# Patient Record
Sex: Female | Born: 1975 | Race: Black or African American | Hispanic: No | Smoking: Heavy tobacco smoker
Health system: Southern US, Community
[De-identification: ages and names within clinical notes are randomized; demographics above are authoritative.]

## PROBLEM LIST (undated history)

## (undated) DIAGNOSIS — J45909 Unspecified asthma, uncomplicated: Secondary | ICD-10-CM

## (undated) DIAGNOSIS — R569 Unspecified convulsions: Secondary | ICD-10-CM

## (undated) DIAGNOSIS — E079 Disorder of thyroid, unspecified: Secondary | ICD-10-CM

## (undated) DIAGNOSIS — D573 Sickle-cell trait: Secondary | ICD-10-CM

## (undated) DIAGNOSIS — E119 Type 2 diabetes mellitus without complications: Secondary | ICD-10-CM

## (undated) HISTORY — PX: TONSILLECTOMY: SUR1361

## (undated) HISTORY — PX: TUBAL LIGATION: SHX77

## (undated) HISTORY — PX: CYSTECTOMY: SHX5119

---

## 2009-11-09 ENCOUNTER — Ambulatory Visit: Payer: Self-pay | Admitting: Family Medicine

## 2010-01-12 ENCOUNTER — Emergency Department: Payer: Self-pay | Admitting: Emergency Medicine

## 2012-08-28 ENCOUNTER — Emergency Department: Payer: Self-pay | Admitting: Emergency Medicine

## 2012-08-28 LAB — CBC WITH DIFFERENTIAL/PLATELET
Basophil #: 0.1 10*3/uL (ref 0.0–0.1)
Basophil %: 0.6 %
Eosinophil %: 0.4 %
HCT: 37.1 % (ref 35.0–47.0)
Lymphocyte %: 31.2 %
MCH: 32.9 pg (ref 26.0–34.0)
MCHC: 34 g/dL (ref 32.0–36.0)
Monocyte %: 9.1 %
Platelet: 266 10*3/uL (ref 150–440)
WBC: 9.2 10*3/uL (ref 3.6–11.0)

## 2012-08-28 LAB — COMPREHENSIVE METABOLIC PANEL
Anion Gap: 9 (ref 7–16)
BUN: 10 mg/dL (ref 7–18)
Bilirubin,Total: 0.5 mg/dL (ref 0.2–1.0)
Calcium, Total: 9 mg/dL (ref 8.5–10.1)
Chloride: 111 mmol/L — ABNORMAL HIGH (ref 98–107)
Co2: 24 mmol/L (ref 21–32)
EGFR (Non-African Amer.): >60
Glucose: 96 mg/dL (ref 65–99)
Osmolality: 286 (ref 275–301)
Potassium: 3.8 mmol/L (ref 3.5–5.1)
SGPT (ALT): 18 U/L (ref 12–78)

## 2012-08-28 LAB — URINALYSIS, COMPLETE
Bilirubin,UR: NEGATIVE
Ketone: NEGATIVE
Nitrite: POSITIVE
Ph: 5 (ref 4.5–8.0)
Protein: 30
Squamous Epithelial: 17
WBC UR: 67 /HPF (ref 0–5)

## 2012-08-28 LAB — DRUG SCREEN, URINE
Amphetamines, Ur Screen: NEGATIVE (ref ?–1000)
Barbiturates, Ur Screen: NEGATIVE (ref ?–200)
Benzodiazepine, Ur Scrn: NEGATIVE (ref ?–200)
Cannabinoid 50 Ng, Ur ~~LOC~~: POSITIVE (ref ?–50)
Cocaine Metabolite,Ur ~~LOC~~: NEGATIVE (ref ?–300)
MDMA (Ecstasy)Ur Screen: NEGATIVE (ref ?–500)
Methadone, Ur Screen: NEGATIVE (ref ?–300)
Opiate, Ur Screen: NEGATIVE (ref ?–300)
Phencyclidine (PCP) Ur S: NEGATIVE (ref ?–25)
Tricyclic, Ur Screen: NEGATIVE (ref ?–1000)

## 2012-08-28 LAB — ETHANOL
Ethanol %: 0.102 % — ABNORMAL HIGH (ref 0.000–0.080)
Ethanol: 102 mg/dL

## 2012-12-06 ENCOUNTER — Emergency Department: Payer: Self-pay | Admitting: Emergency Medicine

## 2012-12-06 LAB — COMPREHENSIVE METABOLIC PANEL
Anion Gap: 7 (ref 7–16)
Anion Gap: 11 (ref 7–16)
BUN: 11 mg/dL (ref 7–18)
Bilirubin,Total: 0.4 mg/dL (ref 0.2–1.0)
Bilirubin,Total: 0.5 mg/dL (ref 0.2–1.0)
Chloride: 107 mmol/L (ref 98–107)
Chloride: 104 mmol/L (ref 98–107)
Co2: 20 mmol/L — ABNORMAL LOW (ref 21–32)
Creatinine: 0.68 mg/dL (ref 0.60–1.30)
Creatinine: 0.76 mg/dL (ref 0.60–1.30)
EGFR (African American): >60
EGFR (African American): >60
Glucose: 84 mg/dL (ref 65–99)
Glucose: 102 mg/dL — ABNORMAL HIGH (ref 65–99)
Osmolality: 271 (ref 275–301)
Osmolality: 269 (ref 275–301)
Potassium: 3.7 mmol/L (ref 3.5–5.1)
SGOT(AST): 23 U/L (ref 15–37)
SGPT (ALT): 25 U/L (ref 12–78)
SGPT (ALT): 29 U/L (ref 12–78)
Sodium: 135 mmol/L — ABNORMAL LOW (ref 136–145)

## 2012-12-06 LAB — CBC WITH DIFFERENTIAL/PLATELET
Basophil #: 0 10*3/uL (ref 0.0–0.1)
Basophil %: 0.5 %
Eosinophil %: 0.3 %
HCT: 36.9 % (ref 35.0–47.0)
Lymphocyte #: 1.6 10*3/uL (ref 1.0–3.6)
MCH: 32.6 pg (ref 26.0–34.0)
MCHC: 34.2 g/dL (ref 32.0–36.0)
Monocyte #: 0.9 x10 3/mm (ref 0.2–0.9)
Neutrophil #: 5 10*3/uL (ref 1.4–6.5)
Neutrophil %: 66.7 %
Platelet: 193 10*3/uL (ref 150–440)

## 2012-12-06 LAB — URINALYSIS, COMPLETE
Bilirubin,UR: NEGATIVE
Blood: NEGATIVE
Glucose,UR: NEGATIVE mg/dL (ref 0–75)
Glucose,UR: NEGATIVE mg/dL (ref 0–75)
Nitrite: POSITIVE
Nitrite: POSITIVE
Ph: 6 (ref 4.5–8.0)
RBC,UR: 4 /HPF (ref 0–5)
RBC,UR: 4 /HPF (ref 0–5)
Specific Gravity: 1.021 (ref 1.003–1.030)
Specific Gravity: 1.016 (ref 1.003–1.030)
Squamous Epithelial: 31
Squamous Epithelial: 12

## 2012-12-06 LAB — CBC
HCT: 37.2 % (ref 35.0–47.0)
MCH: 32.3 pg (ref 26.0–34.0)
MCHC: 34.3 g/dL (ref 32.0–36.0)
Platelet: 221 10*3/uL (ref 150–440)
RBC: 3.95 10*6/uL (ref 3.80–5.20)
RDW: 13.5 % (ref 11.5–14.5)
WBC: 6.8 10*3/uL (ref 3.6–11.0)

## 2012-12-06 LAB — DRUG SCREEN, URINE
Amphetamines, Ur Screen: NEGATIVE (ref ?–1000)
Amphetamines, Ur Screen: NEGATIVE (ref ?–1000)
Benzodiazepine, Ur Scrn: NEGATIVE (ref ?–200)
Benzodiazepine, Ur Scrn: NEGATIVE (ref ?–200)
Cannabinoid 50 Ng, Ur ~~LOC~~: POSITIVE (ref ?–50)
Cannabinoid 50 Ng, Ur ~~LOC~~: POSITIVE (ref ?–50)
Cocaine Metabolite,Ur ~~LOC~~: NEGATIVE (ref ?–300)
Cocaine Metabolite,Ur ~~LOC~~: NEGATIVE (ref ?–300)
MDMA (Ecstasy)Ur Screen: NEGATIVE (ref ?–500)
Methadone, Ur Screen: NEGATIVE (ref ?–300)
Methadone, Ur Screen: NEGATIVE (ref ?–300)
Phencyclidine (PCP) Ur S: NEGATIVE (ref ?–25)
Tricyclic, Ur Screen: NEGATIVE (ref ?–1000)
Tricyclic, Ur Screen: NEGATIVE (ref ?–1000)

## 2012-12-06 LAB — PREGNANCY, URINE: Pregnancy Test, Urine: NEGATIVE m[IU]/mL

## 2014-06-17 NOTE — Consult Note (Signed)
PATIENT NAME:  Jordan Bauer, Jordan Bauer MR#:  [id number] DATE OF BIRTH:  10-05-1975  DATE OF CONSULTATION:  08/28/2012  REFERRING PHYSICIAN:   CONSULTING PHYSICIAN:  Gonzella Lex, MD  IDENTIFYING INFORMATION AND REASON FOR CONSULTATION: A 39 year old woman who came into the hospital agitated. She was uncooperative with treatment and was sedated early in her hospitalization. Consult for psychiatric evaluation.   HISTORY OF PRESENT ILLNESS: Information obtained from the patient and the chart. The chart indicates only that she came into the hospital belligerent and agitated and uncooperative and required sedation. Labs indicate that she was intoxicated and had positive drug screen for cannabis. On evaluation today, the patient reports that she was told she had a seizure. She says she was at a friend's house and remembers her friend telling her that she had a seizure and calling 911. The patient says that seizures are usually episodes in which she gets angry and agitated and emotionally upset and has "blackout" kind of behavior. She admits that she had been drinking and had had 2 drinks yesterday, which is more than her usual. She also admits that she has been using marijuana regularly. She says that she had not been feeling depressed, not been having psychotic symptoms, not been having any other overwhelming stress recently that she can identify. Currently, she denies psychotic symptoms and denies any suicidal or homicidal ideation.   PAST PSYCHIATRIC HISTORY: Denies previous psychiatric hospitalizations. She has been seen at local mental health centers in the past and remembers that she was treated with Zoloft and Risperdal, although she has not been taking them in years. She says she was told that she had bipolar disorder. She does not describe what sounds like a typical mania, however. The patient denies any history of suicide attempts, denies any physical violence towards others.   SUBSTANCE ABUSE HISTORY:  She claims she drinks only infrequently and that she gets drunk very easily. She drank more than usual yesterday. Admits that she uses marijuana on a daily basis. Denies other drugs of abuse.   PAST MEDICAL HISTORY: She claims that she has these seizures very irregularly, cannot really give me much useful history about them. She otherwise denies any significant ongoing medical problems.   SOCIAL HISTORY: The patient is married, has 4 children, 2 of whom still live with her. She works at [employer]. She denies having any acute legal problems.   CURRENT MEDICATIONS: None.   ALLERGIES: AMOXICILLIN, ASPIRIN, KEFLEX.  REVIEW OF SYSTEMS: The patient currently denies depression or anxiety or agitation or anger. Denies suicidal or homicidal ideation. Denies hallucinations. Denies physical symptoms, not feeling dizzy, no pain, no nausea, no pulmonary problems.   MENTAL STATUS EXAMINATION: Slightly disheveled woman who looks her stated age, cooperative with the interview. Eye contact good. Psychomotor activity normal. Speech normal in rate, tone and volume. Affect euthymic, reactive, appropriate. Mood stated as being fine. Thoughts appear to be lucid with no loosening of associations or delusions. Denies auditory or visual hallucinations. Denies suicidal or homicidal ideation. Intelligence probably average. Judgment and insight intermittently impaired, but adequate for the moment. Alert and oriented x 4.   LABORATORY RESULTS: Labs when she presented to the Emergency Room include alcohol level of 102, magnesium 1.8. Chemistry profile normal, except for a slightly elevated chloride of no significance. Drug screen positive for cannabis. CBC normal.  Urinalysis likely positive urinary tract infection, multiple signs of it.  ASSESSMENT: A 39 year old woman who presents agitated while intoxicated. Was belligerent and uncooperative and  sedated in the Emergency Room. Several hours later she has sobered up. She  is currently cooperative and appropriate. Affect is normal. Thoughts appear to be lucid and normal. No sign of psychotic thinking. Denies suicidal or homicidal ideation. She is not showing acute signs of alcohol withdrawal. At this point, the patient no longer meets commitment criteria. The patient probably was intoxicated and may have a tendency to emotional dysregulation as she has suggested. Now that she has sobered up she appears to be back at her baseline and not acutely dangerous. We have been in contact with her husband who agrees that she is not acutely dangerous and has no concerns about her returning home.   TREATMENT PLAN: Discontinue commitment. I counseled the patient on the importance of avoiding alcohol if she is prone to seizures and following up with outpatient mental health treatment. She will be given a referral to local mental health agencies and it is recommended that she follow up there for evaluation and treatment of her mood instability. The Emergency Room doctor can follow up with treating her urinary tract infection. No longer needs psychiatric treatment in the hospital.   DIAGNOSIS, PRINCIPAL AND PRIMARY:  AXIS I: Alcohol intoxication.   SECONDARY DIAGNOSES: AXIS I:  1.  Adjustment disorder with mixed disturbance of emotions and conduct, resolved.  2.  Marijuana abuse.  AXIS II: Deferred.  AXIS III: Urinary tract infection.  AXIS IV: Moderate chronic stress from lack of resources, lack of finances to get medical treatment.  AXIS V: Functioning at time of evaluation 45. ____________________________ Gonzella Lex, MD jtc:sb D: 08/28/2012 15:26:38 ET T: 08/28/2012 15:35:25 ET JOB#: [id number]  cc: Gonzella Lex, MD, <Dictator> Gonzella Lex MD ELECTRONICALLY SIGNED 08/30/2012 16:29

## 2014-06-17 NOTE — Consult Note (Signed)
Brief Consult Note: Diagnosis: Alcohol intoxication.   Patient was seen by consultant.   Consult note dictated.   Discussed with Attending MD.   Comments: Psychiatry: Patient seen and chart reviewed. Case discussed with emergency room attending. This patient presented with agitated behavior. She was intoxicated at the time. After being given time to sober up she reports that she had a "seizure". Her description of it sounds like a emotional behavior problem when she was intoxicated and angry. She currently denies any psychotic symptoms. Denies any suicidal or homicidal ideation. She is physically stable. No sign of thought disorder. Calm and cooperative now. Husband agrees to have her come home. Patient has been counseled on the importance of not drinking if she has a tendency towards seizures and of going back to the mental health center to see a doctor about mood stabilization. She agrees to this. No longer meets commitment criteria. Commitment discontinued patient may be released from the emergency room once medically stable.  Electronic Signatures: Gonzella Lex (MD)  (Signed 04-Jul-14 15:19)  Authored: Brief Consult Note   Last Updated: 04-Jul-14 15:19 by Gonzella Lex (MD)

## 2014-09-03 ENCOUNTER — Encounter: Payer: Self-pay | Admitting: Emergency Medicine

## 2014-09-03 ENCOUNTER — Emergency Department
Admission: EM | Admit: 2014-09-03 | Discharge: 2014-09-03 | Disposition: A | Discharge status: Home / Self Care | Payer: Medicaid Other | Attending: Emergency Medicine | Admitting: Emergency Medicine

## 2014-09-03 ENCOUNTER — Other Ambulatory Visit: Payer: Self-pay

## 2014-09-03 DIAGNOSIS — Z91040 Latex allergy status: Secondary | ICD-10-CM | POA: Insufficient documentation

## 2014-09-03 DIAGNOSIS — F419 Anxiety disorder, unspecified: Secondary | ICD-10-CM | POA: Diagnosis not present

## 2014-09-03 DIAGNOSIS — Z880 Allergy status to penicillin: Secondary | ICD-10-CM | POA: Insufficient documentation

## 2014-09-03 DIAGNOSIS — Z563 Stressful work schedule: Secondary | ICD-10-CM | POA: Diagnosis not present

## 2014-09-03 DIAGNOSIS — Z566 Other physical and mental strain related to work: Secondary | ICD-10-CM

## 2014-09-03 DIAGNOSIS — Z79899 Other long term (current) drug therapy: Secondary | ICD-10-CM | POA: Insufficient documentation

## 2014-09-03 DIAGNOSIS — Z720 Tobacco use: Secondary | ICD-10-CM | POA: Insufficient documentation

## 2014-09-03 DIAGNOSIS — R569 Unspecified convulsions: Secondary | ICD-10-CM | POA: Diagnosis present

## 2014-09-03 HISTORY — DX: Unspecified asthma, uncomplicated: J45.909

## 2014-09-03 HISTORY — DX: Unspecified convulsions: R56.9

## 2014-09-03 LAB — GLUCOSE, CAPILLARY: Glucose-Capillary: 127 mg/dL — ABNORMAL HIGH (ref 65–99)

## 2014-09-03 MED ORDER — TOPIRAMATE 100 MG PO TABS
100.0000 mg | ORAL_TABLET | ORAL | Status: AC
  Administered 2014-09-03: 100 mg via ORAL
  Filled 2014-09-03: qty 1

## 2014-09-03 NOTE — Discharge Instructions (Signed)
Panic Attacks  I do not believe that you had a seizure such as "epilepsy", but rather use likely had some type of a stress or anxiety reaction to the situation you're in. Please follow up with [facility] regarding further management of your symptoms and stress, you may wish to follow up with neurology for further evaluation with Dr. Melrose Nakayama.  I do recommend that you NOT drive until you have been cleared by your doctor to do so. Please return to the emergency room right away should you exhibit another spell of possible seizure, develop any chest pain, trouble breathing, severe headache, numbness or tingling in arm or leg, or other concerns arise.  Panic attacks are sudden, short-livedsurges of severe anxiety, fear, or discomfort. They may occur for no reason when you are relaxed, when you are anxious, or when you are sleeping. Panic attacks may occur for a number of reasons:   Healthy people occasionally have panic attacks in extreme, life-threatening situations, such as war or natural disasters. Normal anxiety is a protective mechanism of the body that helps [place] react to danger (fight or flight response).  Panic attacks are often seen with anxiety disorders, such as panic disorder, social anxiety disorder, generalized anxiety disorder, and phobias. Anxiety disorders cause excessive or uncontrollable anxiety. They may interfere with your relationships or other life activities.  Panic attacks are sometimes seen with other mental illnesses, such as depression and posttraumatic stress disorder.  Certain medical conditions, prescription medicines, and drugs of abuse can cause panic attacks. SYMPTOMS  Panic attacks start suddenly, peak within 20 minutes, and are accompanied by four or more of the following symptoms:  Pounding heart or fast heart rate (palpitations).  Sweating.  Trembling or shaking.  Shortness of breath or feeling smothered.  Feeling choked.  Chest pain or discomfort.  Nausea or  strange feeling in your stomach.  Dizziness, light-headedness, or feeling like you will faint.  Chills or hot flushes.  Numbness or tingling in your lips or hands and feet.  Feeling that things are not real or feeling that you are not yourself.  Fear of losing control or going crazy.  Fear of dying. Some of these symptoms can mimic serious medical conditions. For example, you may think you are having a heart attack. Although panic attacks can be very scary, they are not life threatening. DIAGNOSIS  Panic attacks are diagnosed through an assessment by your health care provider. Your health care provider will ask questions about your symptoms, such as where and when they occurred. Your health care provider will also ask about your medical history and use of alcohol and drugs, including prescription medicines. Your health care provider may order blood tests or other studies to rule out a serious medical condition. Your health care provider may refer you to a mental health professional for further evaluation. TREATMENT   Most healthy people who have one or two panic attacks in an extreme, life-threatening situation will not require treatment.  The treatment for panic attacks associated with anxiety disorders or other mental illness typically involves counseling with a mental health professional, medicine, or a combination of both. Your health care provider will help determine what treatment is best for you.  Panic attacks due to physical illness usually go away with treatment of the illness. If prescription medicine is causing panic attacks, talk with your health care provider about stopping the medicine, decreasing the dose, or substituting another medicine.  Panic attacks due to alcohol or drug abuse go away with  abstinence. Some adults need professional help in order to stop drinking or using drugs. HOME CARE INSTRUCTIONS   Take all medicines as directed by your health care provider.    Schedule and attend follow-up visits as directed by your health care provider. It is important to keep all your appointments. SEEK MEDICAL CARE IF:  You are not able to take your medicines as prescribed.  Your symptoms do not improve or get worse. SEEK IMMEDIATE MEDICAL CARE IF:   You experience panic attack symptoms that are different than your usual symptoms.  You have serious thoughts about hurting yourself or others.  You are taking medicine for panic attacks and have a serious side effect. MAKE SURE YOU:  Understand these instructions.  Will watch your condition.  Will get help right away if you are not doing well or get worse. Document Released: 02/11/2005 Document Revised: 02/16/2013 Document Reviewed: 09/25/2012 [facility] Patient Information 2015 [place], [place]. This information is not intended to replace advice given to you by your health care provider. Make sure you discuss any questions you have with your health care provider.
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
## 2014-11-12 ENCOUNTER — Encounter: Payer: Self-pay | Admitting: Emergency Medicine

## 2014-11-12 ENCOUNTER — Emergency Department
Admission: EM | Admit: 2014-11-12 | Discharge: 2014-11-12 | Disposition: A | Discharge status: Home / Self Care | Payer: Medicaid Other | Attending: Emergency Medicine | Admitting: Emergency Medicine

## 2014-11-12 ENCOUNTER — Emergency Department: Payer: Medicaid Other

## 2014-11-12 DIAGNOSIS — Y998 Other external cause status: Secondary | ICD-10-CM | POA: Diagnosis not present

## 2014-11-12 DIAGNOSIS — S99922A Unspecified injury of left foot, initial encounter: Secondary | ICD-10-CM | POA: Diagnosis present

## 2014-11-12 DIAGNOSIS — Z720 Tobacco use: Secondary | ICD-10-CM | POA: Insufficient documentation

## 2014-11-12 DIAGNOSIS — Y9389 Activity, other specified: Secondary | ICD-10-CM | POA: Diagnosis not present

## 2014-11-12 DIAGNOSIS — S93402A Sprain of unspecified ligament of left ankle, initial encounter: Secondary | ICD-10-CM

## 2014-11-12 DIAGNOSIS — W1839XA Other fall on same level, initial encounter: Secondary | ICD-10-CM | POA: Insufficient documentation

## 2014-11-12 DIAGNOSIS — Z79899 Other long term (current) drug therapy: Secondary | ICD-10-CM | POA: Insufficient documentation

## 2014-11-12 DIAGNOSIS — Z91040 Latex allergy status: Secondary | ICD-10-CM | POA: Diagnosis not present

## 2014-11-12 DIAGNOSIS — Y92009 Unspecified place in unspecified non-institutional (private) residence as the place of occurrence of the external cause: Secondary | ICD-10-CM | POA: Insufficient documentation

## 2014-11-12 DIAGNOSIS — S93602A Unspecified sprain of left foot, initial encounter: Secondary | ICD-10-CM | POA: Diagnosis not present

## 2014-11-12 DIAGNOSIS — Z880 Allergy status to penicillin: Secondary | ICD-10-CM | POA: Insufficient documentation

## 2014-11-12 MED ORDER — IBUPROFEN 800 MG PO TABS: 800.0000 mg | ORAL_TABLET | Freq: Once | ORAL | Status: DC

## 2014-11-12 MED ORDER — NAPROXEN 500 MG PO TABS
500.0000 mg | ORAL_TABLET | Freq: Once | ORAL | Status: AC
  Administered 2014-11-12: 500 mg via ORAL
  Filled 2014-11-12: qty 1

## 2014-11-12 NOTE — ED Provider Notes (Signed)
[facility] Emergency Department Provider Note ____________________________________________  Time seen: 1555  I have reviewed the triage vital signs and the nursing notes.  HISTORY  Chief Complaint  Foot Pain  HPI Jordan Bauer is a 39 y.o. female ports to the ED for evaluation of injury to her left foot and ankle after a fall at home today. She describes she was crawling on the roof of her car, when she slid off the car. She somehow injured her left foot and ankle, and the fall was witnessed by her neighbor. She has a history of seizure disorder, but there was no witnessed seizure before, during or after her accident. She had primarily complaining of left ankle pain.Rates her pain at a 10/10 in triage.  Past Medical History  Diagnosis Date  . Seizures   . Sickle cell anemia   . Asthma     There are no active problems to display for this patient.   Past Surgical History  Procedure Laterality Date  . Tonsillectomy    . Tubal ligation      Current Outpatient Rx  Name  Route  Sig  Dispense  Refill  . albuterol (PROVENTIL HFA;VENTOLIN HFA) 108 (90 BASE) MCG/ACT inhaler   Inhalation   Inhale 2 puffs into the lungs every 6 (six) hours as needed for wheezing or shortness of breath.         . topiramate (TOPAMAX) 100 MG tablet   Oral   Take 100 mg by mouth 2 (two) times daily.           Allergies Keflex; Amoxicillin; Aspirin; and Latex  No family history on file.  Social History Social History  Substance Use Topics  . Smoking status: Heavy Tobacco Smoker -- 2.00 packs/day  . Smokeless tobacco: None  . Alcohol Use: No    Review of Systems  Constitutional: Negative for fever. Eyes: Negative for visual changes. ENT: Negative for sore throat. Cardiovascular: Negative for chest pain. Respiratory: Negative for shortness of breath. Gastrointestinal: Negative for abdominal pain, vomiting and diarrhea. Genitourinary: Negative for  dysuria. Musculoskeletal: Negative for back pain. Skin: Negative for rash. Neurological: Negative for headaches, focal weakness or numbness. ____________________________________________  PHYSICAL EXAM:  VITAL SIGNS: ED Triage Vitals  Enc Vitals Group     BP 11/12/14 1455 115/73 mmHg     Pulse Rate 11/12/14 1455 100     Resp 11/12/14 1455 18     Temp 11/12/14 1455 98.5 F (36.9 C)     Temp Source 11/12/14 1455 Oral     SpO2 11/12/14 1455 96 %     Weight 11/12/14 1455 147 lb (66.679 kg)     Height 11/12/14 1455 5\' 1"  (1.549 m)     Head Cir --      Peak Flow --      Pain Score 11/12/14 1456 10     Pain Loc --      Pain Edu? --      Excl. in [place]? --    Constitutional: Alert and oriented. Well appearing and in no distress. Eyes: Conjunctivae are normal. PERRL. Normal extraocular movements. ENT   Head: Normocephalic and atraumatic.   Nose: No congestion/rhinorrhea.   Mouth/Throat: Mucous membranes are moist.   Neck: Supple. No thyromegaly. Hematological/Lymphatic/Immunological: No cervical lymphadenopathy. Cardiovascular: Normal rate, regular rhythm. Normal distal pulses. Respiratory: Normal respiratory effort. No wheezes/rales/rhonchi. Gastrointestinal: Soft and nontender. No distention. Musculoskeletal: The foot and ankle without obvious deformity, effusion, abrasion, or swelling. No calf or Achilles  tenderness. Negative ankle drawer maneuver. Normal knee exam. Nontender with normal range of motion in all other extremities.  Neurologic:  Normal gait without ataxia. Normal speech and language. No gross focal neurologic deficits are appreciated. Skin:  Skin is warm, dry and intact. No rash noted. Psychiatric: Mood and affect are normal. Patient exhibits appropriate insight and judgment. ____________________________________________   RADIOLOGY  Left Ankle & Foot  Negative I, Menshew, Dannielle Karvonen, personally viewed and evaluated these images (plain radiographs)  as part of my medical decision making.  ____________________________________________  PROCEDURES  Naproxen 500 mg PO ACE bandage ____________________________________________  INITIAL IMPRESSION / ASSESSMENT AND PLAN / ED COURSE  Left foot and ankle sprain without obvious deformity or radiologic evidence of bony injury. We'll treat with Ace bandage and patient will dose Aleve as needed for pain. Patient felt the primary care provider for ongoing symptoms. ____________________________________________  FINAL CLINICAL IMPRESSION(S) / ED DIAGNOSES  Final diagnoses:  Ankle sprain, left, initial encounter  Foot sprain, left, initial encounter     Melvenia Needles, PA-C 11/12/14 1617  Nance Pear, MD 11/12/14 1729

## 2014-11-12 NOTE — Discharge Instructions (Signed)
Ankle Sprain An ankle sprain is an injury to the strong, fibrous tissues (ligaments) that hold your ankle bones together.  HOME CARE   Put ice on your ankle for 1-2 days or as told by your doctor.  Put ice in a plastic bag.  Place a towel between your skin and the bag.  Leave the ice on for 15-20 minutes at a time, every 2 hours while you are awake.  Only take medicine as told by your doctor.  Raise (elevate) your injured ankle above the level of your heart as much as possible for 2-3 days.  Use crutches if your doctor tells you to. Slowly put your own weight on the affected ankle. Use the crutches until you can walk without pain.  If you have a plaster splint:  Do not rest it on anything harder than a pillow for 24 hours.  Do not put weight on it.  Do not get it wet.  Take it off to shower or bathe.  If given, use an elastic wrap or support stocking for support. Take the wrap off if your toes lose feeling (numb), tingle, or turn cold or blue.  If you have an air splint:  Add or let out air to make it comfortable.  Take it off at night and to shower and bathe.  Wiggle your toes and move your ankle up and down often while you are wearing it. GET HELP IF:  You have rapidly increasing bruising or puffiness (swelling).  Your toes feel very cold.  You lose feeling in your foot.  Your medicine does not help your pain. GET HELP RIGHT AWAY IF:   Your toes lose feeling (numb) or turn blue.  You have severe pain that is increasing. MAKE SURE YOU:   Understand these instructions.  Will watch your condition.  Will get help right away if you are not doing well or get worse. Document Released: 07/31/2007 Document Revised: 06/28/2013 Document Reviewed: 08/26/2011 [facility] Patient Information 2015 [place], [place]. This information is not intended to replace advice given to you by your health care provider. Make sure you discuss any questions you have with your health care  provider.  Foot Sprain The muscles and cord like structures which attach muscle to bone (tendons) that surround the feet are made up of units. A foot sprain can occur at the weakest spot in any of these units. This condition is most often caused by injury to or overuse of the foot, as from playing contact sports, or aggravating a previous injury, or from poor conditioning, or obesity. SYMPTOMS  Pain with movement of the foot.  Tenderness and swelling at the injury site.  Loss of strength is present in moderate or severe sprains. THE THREE GRADES OR SEVERITY OF FOOT SPRAIN ARE:  Mild (Grade I): Slightly pulled muscle without tearing of muscle or tendon fibers or loss of strength.  Moderate (Grade II): Tearing of fibers in a muscle, tendon, or at the attachment to bone, with small decrease in strength.  Severe (Grade III): Rupture of the muscle-tendon-bone attachment, with separation of fibers. Severe sprain requires surgical repair. Often repeating (chronic) sprains are caused by overuse. Sudden (acute) sprains are caused by direct injury or over-use. DIAGNOSIS  Diagnosis of this condition is usually by your own observation. If problems continue, a caregiver may be required for further evaluation and treatment. X-rays may be required to make sure there are not breaks in the bones (fractures) present. Continued problems may require physical therapy  for treatment. PREVENTION  Use strength and conditioning exercises appropriate for your sport.  Warm up properly prior to working out.  Use athletic shoes that are made for the sport you are participating in.  Allow adequate time for healing. Early return to activities makes repeat injury more likely, and can lead to an unstable arthritic foot that can result in prolonged disability. Mild sprains generally heal in 3 to 10 days, with moderate and severe sprains taking 2 to 10 weeks. Your caregiver can help you determine the proper time required  for healing. HOME CARE INSTRUCTIONS   Apply ice to the injury for 15-20 minutes, 03-04 times per day. Put the ice in a plastic bag and place a towel between the bag of ice and your skin.  An elastic wrap (like an Ace bandage) may be used to keep swelling down.  Keep foot above the level of the heart, or at least raised on a footstool, when swelling and pain are present.  Try to avoid use other than gentle range of motion while the foot is painful. Do not resume use until instructed by your caregiver. Then begin use gradually, not increasing use to the point of pain. If pain does develop, decrease use and continue the above measures, gradually increasing activities that do not cause discomfort, until you gradually achieve normal use.  Use crutches if and as instructed, and for the length of time instructed.  Keep injured foot and ankle wrapped between treatments.  Massage foot and ankle for comfort and to keep swelling down. Massage from the toes up towards the knee.  Only take over-the-counter or prescription medicines for pain, discomfort, or fever as directed by your caregiver. SEEK IMMEDIATE MEDICAL CARE IF:   Your pain and swelling increase, or pain is not controlled with medications.  You have loss of feeling in your foot or your foot turns cold or blue.  You develop new, unexplained symptoms, or an increase of the symptoms that brought you to your caregiver. MAKE SURE YOU:   Understand these instructions.  Will watch your condition.  Will get help right away if you are not doing well or get worse. Document Released: 08/03/2001 Document Revised: 05/06/2011 Document Reviewed: 10/01/2007 [facility] Patient Information 2015 [place], [place]. This information is not intended to replace advice given to you by your health care provider. Make sure you discuss any questions you have with your health care provider.  Wear the Ace bandage as needed for support. Apply ice to reduce pain.  Take OTC ibuprofen or naproxen as needed for pain.

## 2014-11-12 NOTE — ED Notes (Signed)
States was working out in [employer] car, felt weak and fell back, pain L foot,neighbor was with her and no LOC. Weakness has resolved. States has history of seizures but friend with her states she did not have a seizure. Here for ankle pain.

## 2015-05-31 ENCOUNTER — Encounter: Payer: Self-pay | Admitting: Emergency Medicine

## 2015-05-31 ENCOUNTER — Inpatient Hospital Stay: Payer: Medicaid Other

## 2015-05-31 ENCOUNTER — Emergency Department: Payer: Medicaid Other

## 2015-05-31 ENCOUNTER — Inpatient Hospital Stay
Admission: EM | Admit: 2015-05-31 | Discharge: 2015-06-02 | DRG: 871 | Disposition: A | Discharge status: Home / Self Care | Payer: Medicaid Other | Attending: Internal Medicine | Admitting: Internal Medicine

## 2015-05-31 DIAGNOSIS — Z91040 Latex allergy status: Secondary | ICD-10-CM

## 2015-05-31 DIAGNOSIS — A419 Sepsis, unspecified organism: Principal | ICD-10-CM | POA: Diagnosis present

## 2015-05-31 DIAGNOSIS — Z7951 Long term (current) use of inhaled steroids: Secondary | ICD-10-CM | POA: Diagnosis not present

## 2015-05-31 DIAGNOSIS — E871 Hypo-osmolality and hyponatremia: Secondary | ICD-10-CM | POA: Diagnosis present

## 2015-05-31 DIAGNOSIS — M25551 Pain in right hip: Secondary | ICD-10-CM | POA: Diagnosis present

## 2015-05-31 DIAGNOSIS — R748 Abnormal levels of other serum enzymes: Secondary | ICD-10-CM | POA: Diagnosis present

## 2015-05-31 DIAGNOSIS — J189 Pneumonia, unspecified organism: Secondary | ICD-10-CM | POA: Diagnosis not present

## 2015-05-31 DIAGNOSIS — Z880 Allergy status to penicillin: Secondary | ICD-10-CM

## 2015-05-31 DIAGNOSIS — Z8249 Family history of ischemic heart disease and other diseases of the circulatory system: Secondary | ICD-10-CM | POA: Diagnosis not present

## 2015-05-31 DIAGNOSIS — M545 Low back pain: Secondary | ICD-10-CM | POA: Diagnosis present

## 2015-05-31 DIAGNOSIS — Z79899 Other long term (current) drug therapy: Secondary | ICD-10-CM

## 2015-05-31 DIAGNOSIS — E876 Hypokalemia: Secondary | ICD-10-CM | POA: Diagnosis present

## 2015-05-31 DIAGNOSIS — Z886 Allergy status to analgesic agent status: Secondary | ICD-10-CM

## 2015-05-31 DIAGNOSIS — D573 Sickle-cell trait: Secondary | ICD-10-CM | POA: Diagnosis present

## 2015-05-31 DIAGNOSIS — Z833 Family history of diabetes mellitus: Secondary | ICD-10-CM | POA: Diagnosis not present

## 2015-05-31 DIAGNOSIS — F17210 Nicotine dependence, cigarettes, uncomplicated: Secondary | ICD-10-CM | POA: Diagnosis present

## 2015-05-31 DIAGNOSIS — M25552 Pain in left hip: Secondary | ICD-10-CM | POA: Diagnosis present

## 2015-05-31 DIAGNOSIS — J45909 Unspecified asthma, uncomplicated: Secondary | ICD-10-CM | POA: Diagnosis present

## 2015-05-31 DIAGNOSIS — Z23 Encounter for immunization: Secondary | ICD-10-CM | POA: Diagnosis not present

## 2015-05-31 DIAGNOSIS — E860 Dehydration: Secondary | ICD-10-CM | POA: Diagnosis present

## 2015-05-31 DIAGNOSIS — Z885 Allergy status to narcotic agent status: Secondary | ICD-10-CM

## 2015-05-31 DIAGNOSIS — R739 Hyperglycemia, unspecified: Secondary | ICD-10-CM | POA: Diagnosis present

## 2015-05-31 DIAGNOSIS — G40909 Epilepsy, unspecified, not intractable, without status epilepticus: Secondary | ICD-10-CM | POA: Diagnosis present

## 2015-05-31 DIAGNOSIS — Z881 Allergy status to other antibiotic agents status: Secondary | ICD-10-CM | POA: Diagnosis not present

## 2015-05-31 DIAGNOSIS — M25559 Pain in unspecified hip: Secondary | ICD-10-CM

## 2015-05-31 DIAGNOSIS — R509 Fever, unspecified: Secondary | ICD-10-CM

## 2015-05-31 HISTORY — DX: Sickle-cell trait: D57.3

## 2015-05-31 LAB — LACTIC ACID, PLASMA
Lactic Acid, Venous: 1 mmol/L (ref 0.5–2.0)
Lactic Acid, Venous: 1.2 mmol/L (ref 0.5–2.0)

## 2015-05-31 LAB — CBC WITH DIFFERENTIAL/PLATELET
Basophils Absolute: 0 10*3/uL (ref 0–0.1)
Basophils Relative: 0 %
EOS PCT: 0 %
Eosinophils Absolute: 0 10*3/uL (ref 0–0.7)
HCT: 38.3 % (ref 35.0–47.0)
Hemoglobin: 12.9 g/dL (ref 12.0–16.0)
LYMPHS ABS: 1.2 10*3/uL (ref 1.0–3.6)
Lymphocytes Relative: 7 %
MCH: 32.7 pg (ref 26.0–34.0)
MCHC: 33.6 g/dL (ref 32.0–36.0)
MCV: 97.1 fL (ref 80.0–100.0)
MONOS PCT: 6 %
Monocytes Absolute: 1.1 10*3/uL — ABNORMAL HIGH (ref 0.2–0.9)
Neutro Abs: 15.6 10*3/uL — ABNORMAL HIGH (ref 1.4–6.5)
Neutrophils Relative %: 87 %
Platelets: 209 10*3/uL (ref 150–440)
RBC: 3.95 MIL/uL (ref 3.80–5.20)
RDW: 13.9 % (ref 11.5–14.5)
WBC: 18 10*3/uL — ABNORMAL HIGH (ref 3.6–11.0)

## 2015-05-31 LAB — TROPONIN I
Troponin I: 0.07 ng/mL — ABNORMAL HIGH (ref <0.031)
Troponin I: 0.07 ng/mL — ABNORMAL HIGH (ref <0.031)
Troponin I: <0.03 ng/mL (ref <0.031)

## 2015-05-31 LAB — COMPREHENSIVE METABOLIC PANEL
ALT: 13 U/L — AB (ref 14–54)
AST: 24 U/L (ref 15–41)
Albumin: 3.6 g/dL (ref 3.5–5.0)
Alkaline Phosphatase: 66 U/L (ref 38–126)
Anion gap: 9 (ref 5–15)
BUN: 7 mg/dL (ref 6–20)
CHLORIDE: 99 mmol/L — AB (ref 101–111)
CO2: 21 mmol/L — ABNORMAL LOW (ref 22–32)
CREATININE: 1.03 mg/dL — AB (ref 0.44–1.00)
Calcium: 8.6 mg/dL — ABNORMAL LOW (ref 8.9–10.3)
GFR calc Af Amer: >60 mL/min (ref >60)
GFR calc non Af Amer: >60 mL/min (ref >60)
Glucose, Bld: 217 mg/dL — ABNORMAL HIGH (ref 65–99)
Potassium: 3.2 mmol/L — ABNORMAL LOW (ref 3.5–5.1)
Sodium: 129 mmol/L — ABNORMAL LOW (ref 135–145)
TOTAL PROTEIN: 7.6 g/dL (ref 6.5–8.1)
Total Bilirubin: 1.4 mg/dL — ABNORMAL HIGH (ref 0.3–1.2)

## 2015-05-31 LAB — RETICULOCYTES
RBC.: 3.93 MIL/uL (ref 3.80–5.20)
RETIC COUNT ABSOLUTE: 98.3 10*3/uL (ref 19.0–183.0)
Retic Ct Pct: 2.5 % (ref 0.4–3.1)

## 2015-05-31 LAB — POCT PREGNANCY, URINE: Preg Test, Ur: NEGATIVE

## 2015-05-31 LAB — PROCALCITONIN: PROCALCITONIN: 0.27 ng/mL

## 2015-05-31 LAB — MAGNESIUM: MAGNESIUM: 1.7 mg/dL (ref 1.7–2.4)

## 2015-05-31 MED ORDER — MORPHINE SULFATE (PF) 2 MG/ML IV SOLN
2.0000 mg | INTRAVENOUS | Status: DC | PRN
  Administered 2015-05-31 – 2015-06-01 (×2): 2 mg via INTRAVENOUS
  Filled 2015-05-31 (×2): qty 1

## 2015-05-31 MED ORDER — ALBUTEROL SULFATE HFA 108 (90 BASE) MCG/ACT IN AERS: 2.0000 | INHALATION_SPRAY | Freq: Four times a day (QID) | RESPIRATORY_TRACT | Status: DC | PRN

## 2015-05-31 MED ORDER — OXYCODONE-ACETAMINOPHEN 5-325 MG PO TABS
1.0000 | ORAL_TABLET | Freq: Four times a day (QID) | ORAL | Status: DC | PRN
  Administered 2015-05-31 – 2015-06-02 (×4): 2 via ORAL
  Filled 2015-05-31 (×4): qty 2

## 2015-05-31 MED ORDER — VANCOMYCIN HCL IN DEXTROSE 1-5 GM/200ML-% IV SOLN
1000.0000 mg | INTRAVENOUS | Status: DC
  Administered 2015-06-01: 02:00:00 1000 mg via INTRAVENOUS
  Filled 2015-05-31 (×2): qty 200

## 2015-05-31 MED ORDER — POTASSIUM CHLORIDE CRYS ER 20 MEQ PO TBCR
40.0000 meq | EXTENDED_RELEASE_TABLET | Freq: Once | ORAL | Status: AC
  Administered 2015-05-31: 17:00:00 40 meq via ORAL
  Filled 2015-05-31: qty 2

## 2015-05-31 MED ORDER — ONDANSETRON HCL 4 MG PO TABS: 4.0000 mg | ORAL_TABLET | Freq: Four times a day (QID) | ORAL | Status: DC | PRN

## 2015-05-31 MED ORDER — ALBUTEROL SULFATE (2.5 MG/3ML) 0.083% IN NEBU: 2.5000 mg | INHALATION_SOLUTION | RESPIRATORY_TRACT | Status: DC | PRN

## 2015-05-31 MED ORDER — ONDANSETRON HCL 4 MG/2ML IJ SOLN: 4.0000 mg | Freq: Four times a day (QID) | INTRAMUSCULAR | Status: DC | PRN

## 2015-05-31 MED ORDER — CLOPIDOGREL BISULFATE 75 MG PO TABS
75.0000 mg | ORAL_TABLET | Freq: Every day | ORAL | Status: DC
Start: 2015-05-31 — End: 2015-06-01
  Administered 2015-05-31 – 2015-06-01 (×2): 75 mg via ORAL
  Filled 2015-05-31 (×2): qty 1

## 2015-05-31 MED ORDER — NICOTINE 14 MG/24HR TD PT24
14.0000 mg | MEDICATED_PATCH | Freq: Every day | TRANSDERMAL | Status: DC
  Administered 2015-05-31 – 2015-06-02 (×3): 14 mg via TRANSDERMAL
  Filled 2015-05-31 (×4): qty 1

## 2015-05-31 MED ORDER — RISPERIDONE 0.5 MG PO TABS
1.0000 mg | ORAL_TABLET | Freq: Every day | ORAL | Status: DC
  Administered 2015-05-31 – 2015-06-01 (×2): 1 mg via ORAL
  Filled 2015-05-31 (×2): qty 2

## 2015-05-31 MED ORDER — VANCOMYCIN HCL IN DEXTROSE 1-5 GM/200ML-% IV SOLN
1000.0000 mg | Freq: Once | INTRAVENOUS | Status: AC
  Administered 2015-05-31: 18:00:00 1000 mg via INTRAVENOUS
  Filled 2015-05-31: qty 200

## 2015-05-31 MED ORDER — SODIUM CHLORIDE 0.9 % IV BOLUS (SEPSIS)
1000.0000 mL | Freq: Once | INTRAVENOUS | Status: AC
  Administered 2015-05-31: 1000 mL via INTRAVENOUS

## 2015-05-31 MED ORDER — ACETAMINOPHEN 650 MG RE SUPP: 650.0000 mg | Freq: Four times a day (QID) | RECTAL | Status: DC | PRN

## 2015-05-31 MED ORDER — SERTRALINE HCL 50 MG PO TABS
50.0000 mg | ORAL_TABLET | Freq: Every day | ORAL | Status: DC
  Administered 2015-05-31 – 2015-06-01 (×2): 50 mg via ORAL
  Filled 2015-05-31 (×2): qty 1

## 2015-05-31 MED ORDER — ENOXAPARIN SODIUM 40 MG/0.4ML ~~LOC~~ SOLN
40.0000 mg | SUBCUTANEOUS | Status: DC
  Administered 2015-05-31 – 2015-06-01 (×2): 40 mg via SUBCUTANEOUS
  Filled 2015-05-31 (×2): qty 0.4

## 2015-05-31 MED ORDER — GUAIFENESIN 100 MG/5ML PO SOLN: 5.0000 mL | ORAL | Status: DC | PRN

## 2015-05-31 MED ORDER — ACETAMINOPHEN 325 MG PO TABS
650.0000 mg | ORAL_TABLET | Freq: Four times a day (QID) | ORAL | Status: DC | PRN
  Administered 2015-06-01: 02:00:00 650 mg via ORAL
  Filled 2015-05-31: qty 2

## 2015-05-31 MED ORDER — TOPIRAMATE 100 MG PO TABS
100.0000 mg | ORAL_TABLET | Freq: Two times a day (BID) | ORAL | Status: DC
  Administered 2015-05-31 – 2015-06-02 (×4): 100 mg via ORAL
  Filled 2015-05-31 (×4): qty 1

## 2015-05-31 MED ORDER — PNEUMOCOCCAL VAC POLYVALENT 25 MCG/0.5ML IJ INJ
0.5000 mL | INJECTION | INTRAMUSCULAR | Status: AC
  Administered 2015-06-01: 0.5 mL via INTRAMUSCULAR
  Filled 2015-05-31: qty 0.5

## 2015-05-31 MED ORDER — LEVALBUTEROL HCL 1.25 MG/0.5ML IN NEBU: 1.2500 mg | INHALATION_SOLUTION | Freq: Four times a day (QID) | RESPIRATORY_TRACT | Status: DC | PRN

## 2015-05-31 MED ORDER — SODIUM CHLORIDE 0.9 % IV SOLN
INTRAVENOUS | Status: DC
  Administered 2015-05-31 – 2015-06-01 (×2): via INTRAVENOUS

## 2015-05-31 MED ORDER — LEVOFLOXACIN IN D5W 750 MG/150ML IV SOLN
750.0000 mg | INTRAVENOUS | Status: DC
  Administered 2015-06-01 – 2015-06-02 (×2): 750 mg via INTRAVENOUS
  Filled 2015-05-31 (×2): qty 150

## 2015-05-31 MED ORDER — LEVOFLOXACIN IN D5W 750 MG/150ML IV SOLN
750.0000 mg | Freq: Once | INTRAVENOUS | Status: AC
  Administered 2015-05-31: 750 mg via INTRAVENOUS
  Filled 2015-05-31: qty 150

## 2015-05-31 MED ORDER — HYDROMORPHONE HCL 1 MG/ML IJ SOLN
1.0000 mg | Freq: Once | INTRAMUSCULAR | Status: AC
  Administered 2015-05-31: 1 mg via INTRAVENOUS
  Filled 2015-05-31: qty 1

## 2015-05-31 NOTE — ED Notes (Signed)
Pt to ed with c/o cough, congestion, fever, back pain,  States last night noticed a knot in left lower leg that is painful to touch and worse with walking.  Pt states she can not cough due to pain in back.

## 2015-05-31 NOTE — ED Provider Notes (Addendum)
[facility] Emergency Department Provider Note  Time seen: 12:08 PM  I have reviewed the triage vital signs and the nursing notes.   HISTORY  Chief Complaint Back Pain; Cough; Fever; and Sickle Cell Pain Crisis    HPI Jordan Bauer is a 40 y.o. female with a past medical history of sickle cell, seizures, asthma presents the emergency department with cough, congestion, fever, back pain and hip pain. According to the patient she has had a mild cough with congestion and fever for the past 3 days. She states since yesterday she has had significant right lower back pain which radiates around to her right flank. She is also now having pain in her bilateral hips left worse than right, worse with walking. Describes the hip pain as severe. Back pain as severe. Denies any chest pain. States she has had a mild cough with mild congestion. Patient states she had a hematologist in [place] but has not had one for the past 5 years. Does not take any medications at home for her sickle cell disease.     Past Medical History  Diagnosis Date  . Seizures ([place])   . Sickle cell anemia ([facility])   . Asthma     There are no active problems to display for this patient.   Past Surgical History  Procedure Laterality Date  . Tonsillectomy    . Tubal ligation      Current Outpatient Rx  Name  Route  Sig  Dispense  Refill  . albuterol (PROVENTIL HFA;VENTOLIN HFA) 108 (90 BASE) MCG/ACT inhaler   Inhalation   Inhale 2 puffs into the lungs every 6 (six) hours as needed for wheezing or shortness of breath.         . topiramate (TOPAMAX) 100 MG tablet   Oral   Take 100 mg by mouth 2 (two) times daily.           Allergies Keflex; Amoxicillin; Aspirin; and Latex  History reviewed. No pertinent family history.  Social History Social History  Substance Use Topics  . Smoking status: Heavy Tobacco Smoker -- 2.00 packs/day  . Smokeless tobacco: None  . Alcohol Use: No     Review of Systems Constitutional: Positive for fever and congestion. Cardiovascular: Negative for chest pain. Respiratory: Occasional mild cough per patient. Denies sputum. Gastrointestinal: Negative for abdominal pain, vomiting and diarrhea.  Genitourinary: Negative for dysuria. Denies vaginal bleeding. Does state dark urine. Musculoskeletal: Positive for significant right lower back pain. Neurological: Negative for headache 10-point ROS otherwise negative.  ____________________________________________   PHYSICAL EXAM:  VITAL SIGNS: ED Triage Vitals  Enc Vitals Group     BP 05/31/15 1146 125/66 mmHg     Pulse Rate 05/31/15 1146 123     Resp 05/31/15 1146 20     Temp 05/31/15 1146 102 F (38.9 C)     Temp Source 05/31/15 1146 Oral     SpO2 05/31/15 1146 99 %     Weight 05/31/15 1146 141 lb (63.957 kg)     Height 05/31/15 1146 5\' 1"  (1.549 m)     Head Cir --      Peak Flow --      Pain Score 05/31/15 1147 10     Pain Loc --      Pain Edu? --      Excl. in [place]? --     Constitutional: Alert and oriented. Mild distress due to pain. Eyes: Normal exam ENT   Head: Normocephalic and atraumatic.  Nose: Minimal congestion.   Mouth/Throat: Mucous membranes are moist. Cardiovascular: Regular rhythm, rate around 120 bpm. No murmur. Respiratory: Normal respiratory effort without tachypnea nor retractions. Breath sounds are clear and equal bilaterally. No wheezes/rales/rhonchi. Gastrointestinal: Soft and nontender. No distention.  Mild right CVA tenderness to palpation. Musculoskeletal: Significant tenderness to palpation over the right sacroiliac joint, and bilateral hips left greater than right. Good range of motion in hips although with pain. Neurovascular intact distally. Neurologic:  Normal speech and language. No gross focal neurologic deficits. No extremity weakness or numbness noted. Skin:  Skin is warm, dry and intact.  Psychiatric: Mood and affect are normal.    ____________________________________________   RADIOLOGY  Chest x-ray consistent with bibasilar opacities.  EKG reviewed and interpreted by myself shows sinus tachycardia 116 bpm, narrow QRS, normal axis, normal intervals, nonspecific ST changes. No ST elevations. ____________________________________________    INITIAL IMPRESSION / ASSESSMENT AND PLAN / ED COURSE  Pertinent labs & imaging results that were available during my care of the patient were reviewed by me and considered in my medical decision making (see chart for details).  Patient presents the emergency department with cough, congestion, fever, back pain and bilateral hip pain. Patient has a history of sickle cell disease per patient. Has not been seen by hematologist in greater than 5 years. Given her cough, fever, we will order labs, blood cultures, begin treatment with Levaquin as the patient has a cephalosporin allergy. We'll place the patient on oxygen, IV hydrate, while awaiting labs and imaging results.  Patient's labs have resulted showing a leukocytosis of 18,000, normal reticulocyte count, and a hemoglobin of 12.9. Likely with sickle cell straight. Given the Bibasilar opacities with a troponin of 0.07, will continue IV antibiotics and admit to the hospital. I discussed this with hematology/oncology, they are agreeable to this plan.   ____________________________________________   FINAL CLINICAL IMPRESSION(S) / ED DIAGNOSES  Fever Hip pain Cough Bilateral pneumonia  Harvest Dark, MD 05/31/15 [place], MD 05/31/15 1459

## 2015-05-31 NOTE — H&P (Signed)
[place] [facility] [place] NAME: Jordan Bauer    MR#:  [id number]  DATE OF BIRTH:  November 13, 1975  DATE OF ADMISSION:  05/31/2015  PRIMARY CARE PHYSICIAN: No PCP Per Patient   REQUESTING/REFERRING PHYSICIAN: Harvest Dark, MD  CHIEF COMPLAINT:   Chief Complaint  Patient presents with  . Back Pain  . Cough  . Fever  . Sickle Cell Pain Crisis   Fever, cough, sputum and shortness of breath, back and hip pain for 2 days. HISTORY OF PRESENT ILLNESS:  Jordan Bauer  is a 40 y.o. female with a known history of Sickle cell trait, seizure and asthma. The patient present to the ED with above chief complaint. She has had a fever, chills cough with sputum, shortness of breath for the past 2 days. She also complains of lower back pain and bilateral hip pain exacerbated by coughing or movement. She denies any chest pain or palpitation. She denies any ill contacts. She has a fever 102 and tachycardia in the ED. Chest x-ray show bilateral basilar pneumonia. She was treated with Levaquin in the ED.  PAST MEDICAL HISTORY:   Past Medical History  Diagnosis Date  . Seizures ([place])   . Asthma   . Sickle cell trait ([place])     PAST SURGICAL HISTORY:   Past Surgical History  Procedure Laterality Date  . Tonsillectomy    . Tubal ligation      SOCIAL HISTORY:   Social History  Substance Use Topics  . Smoking status: Heavy Tobacco Smoker -- 0.50 packs/day for 20 years  . Smokeless tobacco: Not on file  . Alcohol Use: Yes     Comment: Occasionally.    FAMILY HISTORY:   Family History  Problem Relation Age of Onset  . Diabetes Mother   . Sickle cell trait Mother   . Heart attack Father   . Heart attack Brother   . Sickle cell anemia Other     DRUG ALLERGIES:   Allergies  Allergen Reactions  . Amoxicillin Anaphylaxis, Hives and Other (See Comments)    Has patient had a PCN reaction causing immediate rash, facial/tongue/throat  swelling, SOB or lightheadedness with hypotension: Yes Has patient had a PCN reaction causing severe rash involving mucus membranes or skin necrosis: No Has patient had a PCN reaction that required hospitalization No Has patient had a PCN reaction occurring within the last 10 years: No If all of the above answers are "NO", then may proceed with Cephalosporin use.  . Aspirin Anaphylaxis  . Keflex [Cephalexin] Other (See Comments)    Reaction:  Unknown   . Latex Rash    REVIEW OF SYSTEMS:  CONSTITUTIONAL: Has fever, chills, poor oral intake and generalized weakness.  EYES: No blurred or double vision.  EARS, NOSE, AND THROAT: No tinnitus or ear pain.  RESPIRATORY: HAS  cough, shortness of breath, no wheezing or hemoptysis.  CARDIOVASCULAR: No chest pain, orthopnea, edema.  GASTROINTESTINAL: No nausea, vomiting, diarrhea or abdominal pain.  GENITOURINARY: No dysuria, hematuria.  ENDOCRINE: No polyuria, nocturia,  HEMATOLOGY: No anemia, easy bruising or bleeding SKIN: No rash or lesion. MUSCULOSKELETAL:  has back and bilateral hip pain.  NEUROLOGIC: No tingling, numbness, weakness.  PSYCHIATRY: No anxiety or depression.   MEDICATIONS AT HOME:   Prior to Admission medications   Medication Sig Start Date End Date Taking? Authorizing Provider  albuterol (PROVENTIL HFA;VENTOLIN HFA) 108 (90 BASE) MCG/ACT inhaler Inhale 2 puffs into the lungs every 6 (six)  hours as needed for wheezing or shortness of breath.   Yes Historical Provider, MD  risperiDONE (RISPERDAL) 1 MG tablet Take 1 mg by mouth at bedtime.   Yes Historical Provider, MD  sertraline (ZOLOFT) 50 MG tablet Take 50 mg by mouth at bedtime.   Yes Historical Provider, MD  topiramate (TOPAMAX) 100 MG tablet Take 100 mg by mouth 2 (two) times daily.   Yes Historical Provider, MD      VITAL SIGNS:  Blood pressure 112/78, pulse 118, temperature 102 F (38.9 C), temperature source Oral, resp. rate 23, height 5\' 1"  (1.549 m), weight  63.957 kg (141 lb), last menstrual period 05/08/2015, SpO2 98 %.  PHYSICAL EXAMINATION:  GENERAL:  40 y.o.-year-old patient lying in the bed with no acute distress.  EYES: Pupils equal, round, reactive to light and accommodation. No scleral icterus. Extraocular muscles intact.  HEENT: Head atraumatic, normocephalic. Oropharynx and nasopharynx clear.  NECK:  Supple, no jugular venous distention. No thyroid enlargement, no tenderness.  LUNGS: Normal breath sounds bilaterally, no wheezing, rales,rhonchi or crepitation. No use of accessory muscles of respiration.  CARDIOVASCULAR: S1, S2 normal. No murmurs, rubs, or gallops.  ABDOMEN: Soft, nontender, nondistended. Bowel sounds present. No organomegaly or mass.  EXTREMITIES: No pedal edema, cyanosis, or clubbing. Tenderness on lower back and bilateral hips.  NEUROLOGIC: Cranial nerves II through XII are intact. Muscle strength 5/5 in all extremities. Sensation intact. Gait not checked.  PSYCHIATRIC: The patient is alert and oriented x 3.  SKIN: No obvious rash, lesion, or ulcer.   LABORATORY PANEL:   CBC  Recent Labs Lab 05/31/15 1152  WBC 18.0*  HGB 12.9  HCT 38.3  PLT 209   ------------------------------------------------------------------------------------------------------------------  Chemistries   Recent Labs Lab 05/31/15 1152  NA 129*  K 3.2*  CL 99*  CO2 21*  GLUCOSE 217*  BUN 7  CREATININE 1.03*  CALCIUM 8.6*  AST 24  ALT 13*  ALKPHOS 66  BILITOT 1.4*   ------------------------------------------------------------------------------------------------------------------  Cardiac Enzymes  Recent Labs Lab 05/31/15 1152  TROPONINI 0.07*   ------------------------------------------------------------------------------------------------------------------  RADIOLOGY:  Dg Chest 2 View  05/31/2015  CLINICAL DATA:  40 year old female with shortness of breath chest congestion and fever. Initial encounter. Smoker. EXAM:  CHEST  2 VIEW COMPARISON:  Chest CT and radiographs 01/12/2010 FINDINGS: Stable lung volumes. Mediastinal contours remain normal. Visualized tracheal air column is within normal limits. No pneumothorax or pulmonary edema. No pleural effusion or consolidation but there is confluent endplate like patchy bibasilar opacity. Widespread increased peribronchial lower lobe opacity was noted in 2011. No other confluent opacity. No acute osseous abnormality identified. IMPRESSION: Bibasilar pulmonary opacity is suspicious for recurrent lower lobe infection in this setting, but might reflect postinflammatory scarring. No pleural effusion. Electronically Signed   By: Genevie Ann M.D.   On: 05/31/2015 12:32    EKG:   Orders placed or performed during the hospital encounter of 09/03/14  . ED EKG  . ED EKG    IMPRESSION AND PLAN:   Sepsis with pneumonia (fever, tachycardia and leukocytosis). The patient will be admitted to medical floor. I will continue Levaquin and vancomycin. Follow-up CBC, sputum and blood culture. Start Xopenex when necessary.  Elevated troponin. Possible due to sepsis. Follow-up troponin level, start Plavix, check lipid panel. The patient is allergic to aspirin.  Hyponatremia. Start normal saline and follow-up BMP. Hypokalemia. Give potassium supplement, follow-up magnesium level and potassium level. Dehydration. Continue IV fluid support.  Elevated blood glucose. Check hemoglobin A1c.  Bilateral  hip and back pain. Get x-ray. Pain control.  Tobacco abuse. Smoking cessation was counseled for 3-4 minutes. Give nicotine patch.   All the records are reviewed and case discussed with ED provider. Management plans discussed with the patient, family and they are in agreement.  CODE STATUS: Full code  TOTAL TIME TAKING CARE OF THIS PATIENT: 62 minutes.    Demetrios Loll M.D on 05/31/2015 at 2:15 PM  Between 7am to 6pm - Pager - [phone]  After 6pm go to [email or web address] - password [email or web address]  [facility]  [facility]  Office  [phone]  CC: Primary care physician; No PCP Per Patient

## 2015-05-31 NOTE — Progress Notes (Signed)
Pharmacy Antibiotic Note  Bethene Blood is a 40 y.o. female admitted on 05/31/2015 with sepsis.  Pharmacy has been consulted for Vancomycin and levofloxacin  dosing.  Plan:  Patient ordered Vancomycin 1 g IV x 1. PK parameters:  Kel (hr-1): 0.049 Half-life (hrs): 14.15 Vd (liters): 44.80 (factor used: 0.7 L/kg)  Will start Vancomycin 1 g IV q18 hours starting @ 02:00 on 4/6. Trough level ordered for 07:30 on 4/8.   Height: 5\' 1"  (154.9 cm) Weight: 141 lb (63.957 kg) IBW/kg (Calculated) : 47.8  Temp (24hrs), Avg:100.6 F (38.1 C), Min:99.7 F (37.6 C), Max:102 F (38.9 C)   Recent Labs Lab 05/31/15 1152 05/31/15 1412  WBC 18.0*  --   CREATININE 1.03*  --   LATICACIDVEN  --  1.0    Estimated Creatinine Clearance: 61.6 mL/min (by C-G formula based on Cr of 1.03).    Allergies  Allergen Reactions  . Amoxicillin Anaphylaxis, Hives and Other (See Comments)    Has patient had a PCN reaction causing immediate rash, facial/tongue/throat swelling, SOB or lightheadedness with hypotension: Yes Has patient had a PCN reaction causing severe rash involving mucus membranes or skin necrosis: No Has patient had a PCN reaction that required hospitalization No Has patient had a PCN reaction occurring within the last 10 years: No If all of the above answers are "NO", then may proceed with Cephalosporin use.  . Aspirin Anaphylaxis  . Keflex [Cephalexin] Other (See Comments)    Reaction:  Unknown   . Latex Rash    Antimicrobials this admission:   Vancomycin 4/5 >>    Levofloxacin 4/5 >>   Dose adjustments this admission:   Microbiology results:  BCx:  NGTD   UCx:    Sputum:    MRSA PCR:   Thank you for allowing pharmacy to be a part of this patient's care.  Berlie Persky D 05/31/2015 4:56 PM

## 2015-06-01 LAB — BASIC METABOLIC PANEL
Anion gap: 4 — ABNORMAL LOW (ref 5–15)
BUN: 6 mg/dL (ref 6–20)
CHLORIDE: 108 mmol/L (ref 101–111)
CO2: 20 mmol/L — ABNORMAL LOW (ref 22–32)
CREATININE: 0.87 mg/dL (ref 0.44–1.00)
Calcium: 7.9 mg/dL — ABNORMAL LOW (ref 8.9–10.3)
Glucose, Bld: 146 mg/dL — ABNORMAL HIGH (ref 65–99)
POTASSIUM: 3 mmol/L — AB (ref 3.5–5.1)
SODIUM: 132 mmol/L — AB (ref 135–145)

## 2015-06-01 LAB — CBC
HCT: 33 % — ABNORMAL LOW (ref 35.0–47.0)
Hemoglobin: 11.1 g/dL — ABNORMAL LOW (ref 12.0–16.0)
MCH: 32.8 pg (ref 26.0–34.0)
MCHC: 33.5 g/dL (ref 32.0–36.0)
MCV: 97.8 fL (ref 80.0–100.0)
PLATELETS: 176 10*3/uL (ref 150–440)
RBC: 3.38 MIL/uL — AB (ref 3.80–5.20)
RDW: 13.9 % (ref 11.5–14.5)
WBC: 11.7 10*3/uL — AB (ref 3.6–11.0)

## 2015-06-01 LAB — INFLUENZA PANEL BY PCR (TYPE A & B)
H1N1 flu by pcr: NOT DETECTED
INFLAPCR: NEGATIVE
Influenza B By PCR: NEGATIVE

## 2015-06-01 LAB — LIPID PANEL
CHOLESTEROL: 127 mg/dL (ref 0–200)
HDL: 49 mg/dL (ref >40)
LDL CALC: 64 mg/dL (ref 0–99)
TRIGLYCERIDES: 72 mg/dL (ref <150)
Total CHOL/HDL Ratio: 2.6 RATIO
VLDL: 14 mg/dL (ref 0–40)

## 2015-06-01 LAB — HEMOGLOBIN A1C: Hgb A1c MFr Bld: 5 % (ref 4.0–6.0)

## 2015-06-01 MED ORDER — POTASSIUM CHLORIDE CRYS ER 20 MEQ PO TBCR
40.0000 meq | EXTENDED_RELEASE_TABLET | Freq: Once | ORAL | Status: AC
  Administered 2015-06-01: 40 meq via ORAL
  Filled 2015-06-01: qty 2

## 2015-06-01 MED ORDER — MAGNESIUM SULFATE 2 GM/50ML IV SOLN
2.0000 g | Freq: Once | INTRAVENOUS | Status: AC
  Administered 2015-06-01: 2 g via INTRAVENOUS
  Filled 2015-06-01: qty 50

## 2015-06-01 NOTE — Plan of Care (Signed)
Problem: Safety: Goal: Ability to remain free from injury will improve Outcome: Progressing Pt instructed to call for assist to be oob

## 2015-06-01 NOTE — Progress Notes (Signed)
Patient ID: Jordan Bauer, female   DOB: 12/03/1975, 40 y.o.   MRN: [id number] Jordan Bauer at [facility] NAME: Jordan Bauer    MR#:  [id number]  DATE OF BIRTH:  April 11, 1975  SUBJECTIVE:   Came in with fever,chills, cough Feels better, eating. Fever+ REVIEW OF SYSTEMS:   Review of Systems  Constitutional: Positive for fever. Negative for chills and weight loss.  HENT: Negative for ear discharge, ear pain and nosebleeds.   Eyes: Negative for blurred vision, pain and discharge.  Respiratory: Positive for cough and shortness of breath. Negative for sputum production, wheezing and stridor.   Cardiovascular: Negative for chest pain, palpitations, orthopnea and PND.  Gastrointestinal: Negative for nausea, vomiting, abdominal pain and diarrhea.  Genitourinary: Negative for urgency and frequency.  Musculoskeletal: Positive for back pain. Negative for joint pain.  Neurological: Positive for weakness. Negative for sensory change, speech change and focal weakness.  Psychiatric/Behavioral: Negative for depression and hallucinations. The patient is not nervous/anxious.   All other systems reviewed and are negative.  Tolerating Diet:YES Tolerating PT: not needed  DRUG ALLERGIES:   Allergies  Allergen Reactions  . Amoxicillin Anaphylaxis, Hives and Other (See Comments)    Has patient had a PCN reaction causing immediate rash, facial/tongue/throat swelling, SOB or lightheadedness with hypotension: Yes Has patient had a PCN reaction causing severe rash involving mucus membranes or skin necrosis: No Has patient had a PCN reaction that required hospitalization No Has patient had a PCN reaction occurring within the last 10 years: No If all of the above answers are "NO", then may proceed with Cephalosporin use.  . Aspirin Anaphylaxis  . Keflex [Cephalexin] Other (See Comments)    Reaction:  Unknown   . Latex Rash    VITALS:  Blood pressure  110/57, pulse 125, temperature 101.1 F (38.4 C), temperature source Oral, resp. rate 20, height 5\' 1"  (1.549 m), weight 63.957 kg (141 lb), last menstrual period 05/08/2015, SpO2 98 %.  PHYSICAL EXAMINATION:   Physical Exam  GENERAL:  40 y.o.-year-old patient lying in the bed with no acute distress.  EYES: Pupils equal, round, reactive to light and accommodation. No scleral icterus. Extraocular muscles intact.  HEENT: Head atraumatic, normocephalic. Oropharynx and nasopharynx clear.  NECK:  Supple, no jugular venous distention. No thyroid enlargement, no tenderness.  LUNGS: Normal breath sounds bilaterally, no wheezing, rales, rhonchi. No use of accessory muscles of respiration.  CARDIOVASCULAR: S1, S2 normal. No murmurs, rubs, or gallops.  ABDOMEN: Soft, nontender, nondistended. Bowel sounds present. No organomegaly or mass.  EXTREMITIES: No cyanosis, clubbing or edema b/l.    NEUROLOGIC: Cranial nerves II through XII are intact. No focal Motor or sensory deficits b/l.   PSYCHIATRIC:  patient is alert and oriented x 3.  SKIN: No obvious rash, lesion, or ulcer.   LABORATORY PANEL:  CBC  Recent Labs Lab 06/01/15 0424  WBC 11.7*  HGB 11.1*  HCT 33.0*  PLT 176    Chemistries   Recent Labs Lab 05/31/15 1152 05/31/15 1641 06/01/15 0424  NA 129*  --  132*  K 3.2*  --  3.0*  CL 99*  --  108  CO2 21*  --  20*  GLUCOSE 217*  --  146*  BUN 7  --  6  CREATININE 1.03*  --  0.87  CALCIUM 8.6*  --  7.9*  MG  --  1.7  --   AST 24  --   --   ALT  13*  --   --   ALKPHOS 66  --   --   BILITOT 1.4*  --   --    Cardiac Enzymes  Recent Labs Lab 05/31/15 2250  TROPONINI <0.03   RADIOLOGY:  Dg Chest 2 View  05/31/2015  CLINICAL DATA:  40 year old female with shortness of breath chest congestion and fever. Initial encounter. Smoker. EXAM: CHEST  2 VIEW COMPARISON:  Chest CT and radiographs 01/12/2010 FINDINGS: Stable lung volumes. Mediastinal contours remain normal. Visualized  tracheal air column is within normal limits. No pneumothorax or pulmonary edema. No pleural effusion or consolidation but there is confluent endplate like patchy bibasilar opacity. Widespread increased peribronchial lower lobe opacity was noted in 2011. No other confluent opacity. No acute osseous abnormality identified. IMPRESSION: Bibasilar pulmonary opacity is suspicious for recurrent lower lobe infection in this setting, but might reflect postinflammatory scarring. No pleural effusion. Electronically Signed   By: Genevie Ann M.D.   On: 05/31/2015 12:32   Dg Pelvis 1-2 Views  05/31/2015  CLINICAL DATA:  40 year old female with increasing bilateral hip pain today, greater on the left. No known injury. Initial encounter. EXAM: PELVIS - 1-2 VIEW COMPARISON:  None. FINDINGS: Portable AP view of the pelvis at 1450 hours. Bone mineralization is within normal limits. Pelvis intact. Femoral heads normally located. Hip joint spaces appear normal. Sacral ala and SI joints appear normal. Negative visualized lower lumbar spine. Normal visualized lower abdominal and pelvic visceral contours. Normal bowel gas pattern. IMPRESSION: Normal radiographic appearance of the pelvis. Electronically Signed   By: Genevie Ann M.D.   On: 05/31/2015 15:06   ASSESSMENT AND PLAN:  Jordan Bauer is a 40 y.o. female with a known history of Sickle cell trait, seizure and asthma. The patient present to the ED with fever, chills cough with sputum, shortness of breath for the past 2 days. She also complains of lower back pain and bilateral hip pain exacerbated by coughing or movement  1.Sepsis with pneumonia (fever, tachycardia and leukocytosis). -cont Levaquin and d/c vancomycin -wbc 18K--11K  2.Elevated troponin. Possible due to sepsis.no risk factors for CAD.d/c plavix  3.Hyponatremia. recieved normal saline and follow-up BMP.  4.Hypokalemia.replete  5.Dehydration. Received IVF  6.Elevated blood glucose.  Mild. No h/o  DM  7.Tobacco abuse. Smoking cessation was counseled for 3-4 minutes. Give nicotine patch.  Case discussed with Care Management/Social Worker. Management plans discussed with the patient, family and they are in agreement.  CODE STATUS: full  DVT Prophylaxis:lovenox  TOTAL TIME TAKING CARE OF THIS PATIENT: 30 minutes.  >50% time spent on counselling and coordination of care  POSSIBLE D/C IN 1 DAYS, DEPENDING ON CLINICAL CONDITION.  Note: This dictation was prepared with Dragon dictation along with smaller phrase technology. Any transcriptional errors that result from this process are unintentional.  Kamaiyah Uselton M.D on 06/01/2015 at 3:57 PM  Between 7am to 6pm - Pager - [phone]  After 6pm go to [email or web address] - password [email or web address] [facility]  [facility]  Office  [phone]  CC: Primary care physician; No PCP Per Patient

## 2015-06-02 ENCOUNTER — Encounter: Payer: Self-pay | Admitting: Student

## 2015-06-02 MED ORDER — GUAIFENESIN 100 MG/5ML PO SOLN: 5.0000 mL | ORAL | Status: DC | PRN

## 2015-06-02 MED ORDER — POTASSIUM CHLORIDE CRYS ER 20 MEQ PO TBCR
60.0000 meq | EXTENDED_RELEASE_TABLET | Freq: Once | ORAL | Status: AC
  Administered 2015-06-02: 60 meq via ORAL
  Filled 2015-06-02: qty 3

## 2015-06-02 MED ORDER — LEVOFLOXACIN 750 MG PO TABS: 750.0000 mg | ORAL_TABLET | Freq: Every day | ORAL | Status: DC

## 2015-06-02 NOTE — Progress Notes (Signed)
Pt for discharge home. Alert. No resp distress. Instructions discussed with pt. presc called in to pts pharmacy.  meds discussed/ diet activity and f/u discussed. Verbalizes understanding. Ready for discharge. Spouse at bedside. Sl d/cd.

## 2015-06-02 NOTE — Discharge Summary (Signed)
[place] [facility] [place] NAME: Jordan Bauer    MR#:  [id number]  DATE OF BIRTH:  1975/04/26  DATE OF ADMISSION:  05/31/2015 ADMITTING PHYSICIAN: Demetrios Loll, MD  DATE OF DISCHARGE:06/02/2015  PRIMARY CARE PHYSICIAN: No PCP Per Patient    ADMISSION DIAGNOSIS:  Bilateral pneumonia [J18.9] Fever, unspecified fever cause [R50.9]  DISCHARGE DIAGNOSIS:  Community acquired pneumonia  SECONDARY DIAGNOSIS:   Past Medical History  Diagnosis Date  . Seizures ([place])   . Asthma   . Sickle cell trait [facility])     HOSPITAL COURSE:   Jordan Bauer is a 40 y.o. female with a known history of Sickle cell trait, seizure and asthma. patient presents to the ED with fever, chills cough with sputum, shortness of breath for the past 2 days. She also complains of lower back pain and bilateral hip pain exacerbated by coughing or movement  1.Sepsis with pneumonia (fever, tachycardia and leukocytosis). -cont Levaquin and d/c vancomycin -wbc 18K--11K -had low grade fever that resolved. Pt feels better and ok to go home  2.Elevated troponin. Possible due to sepsis.no risk factors for CAD.d/c plavix  3.Hyponatremia. recieved normal saline Na 132  4.Hypokalemia.replete  5.Dehydration. Received IVF  6.Elevated blood glucose.  Mild. No h/o DM  7.Tobacco abuse. Smoking cessation was counseled for 3-4 minutes. Give nicotine patch. CONSULTS OBTAINED:     DRUG ALLERGIES:   Allergies  Allergen Reactions  . Amoxicillin Anaphylaxis, Hives and Other (See Comments)    Has patient had a PCN reaction causing immediate rash, facial/tongue/throat swelling, SOB or lightheadedness with hypotension: Yes Has patient had a PCN reaction causing severe rash involving mucus membranes or skin necrosis: No Has patient had a PCN reaction that required hospitalization No Has patient had a PCN reaction occurring within the last 10 years: No If all of the above  answers are "NO", then may proceed with Cephalosporin use.  . Aspirin Anaphylaxis  . Keflex [Cephalexin] Other (See Comments)    Reaction:  Unknown   . Latex Rash    DISCHARGE MEDICATIONS:   Current Discharge Medication List    START taking these medications   Details  guaiFENesin (ROBITUSSIN) 100 MG/5ML SOLN Take 5 mLs (100 mg total) by mouth every 4 (four) hours as needed for cough or to loosen phlegm. Qty: 1200 mL, Refills: 0    levofloxacin (LEVAQUIN) 750 MG tablet Take 1 tablet (750 mg total) by mouth daily. Qty: 4 tablet, Refills: 0      CONTINUE these medications which have NOT CHANGED   Details  albuterol (PROVENTIL HFA;VENTOLIN HFA) 108 (90 BASE) MCG/ACT inhaler Inhale 2 puffs into the lungs every 6 (six) hours as needed for wheezing or shortness of breath.    risperiDONE (RISPERDAL) 1 MG tablet Take 1 mg by mouth at bedtime.    sertraline (ZOLOFT) 50 MG tablet Take 50 mg by mouth at bedtime.    topiramate (TOPAMAX) 100 MG tablet Take 100 mg by mouth 2 (two) times daily.        If you experience worsening of your admission symptoms, develop shortness of breath, life threatening emergency, suicidal or homicidal thoughts you must seek medical attention immediately by calling 911 or calling your MD immediately  if symptoms less severe.  You Must read complete instructions/literature along with all the possible adverse reactions/side effects for all the Medicines you take and that have been prescribed to you. Take any new Medicines after you have completely understood and accept  all the possible adverse reactions/side effects.   Please note  You were cared for by a hospitalist during your hospital stay. If you have any questions about your discharge medications or the care you received while you were in the hospital after you are discharged, you can call the unit and asked to speak with the hospitalist on call if the hospitalist that took care of you is not available. Once  you are discharged, your primary care physician will handle any further medical issues. Please note that NO REFILLS for any discharge medications will be authorized once you are discharged, as it is imperative that you return to your primary care physician (or establish a relationship with a primary care physician if you do not have one) for your aftercare needs so that they can reassess your need for medications and monitor your lab values. Today   SUBJECTIVE   Feels good  VITAL SIGNS:  Blood pressure 107/60, pulse 90, temperature 98.3 F (36.8 C), temperature source Oral, resp. rate 20, height 5\' 1"  (1.549 m), weight 63.957 kg (141 lb), last menstrual period 05/08/2015, SpO2 99 %.  I/O:   Intake/Output Summary (Last 24 hours) at 06/02/15 1144 Last data filed at 06/02/15 0903  Gross per 24 hour  Intake    240 ml  Output    700 ml  Net   -460 ml    PHYSICAL EXAMINATION:  GENERAL:  40 y.o.-year-old patient lying in the bed with no acute distress.  EYES: Pupils equal, round, reactive to light and accommodation. No scleral icterus. Extraocular muscles intact.  HEENT: Head atraumatic, normocephalic. Oropharynx and nasopharynx clear.  NECK:  Supple, no jugular venous distention. No thyroid enlargement, no tenderness.  LUNGS: Normal breath sounds bilaterally, no wheezing, rales,rhonchi or crepitation. No use of accessory muscles of respiration.  CARDIOVASCULAR: S1, S2 normal. No murmurs, rubs, or gallops.  ABDOMEN: Soft, non-tender, non-distended. Bowel sounds present. No organomegaly or mass.  EXTREMITIES: No pedal edema, cyanosis, or clubbing.  NEUROLOGIC: Cranial nerves II through XII are intact. Muscle strength 5/5 in all extremities. Sensation intact. Gait not checked.  PSYCHIATRIC: The patient is alert and oriented x 3.  SKIN: No obvious rash, lesion, or ulcer.   DATA REVIEW:   CBC   Recent Labs Lab 06/01/15 0424  WBC 11.7*  HGB 11.1*  HCT 33.0*  PLT 176    Chemistries    Recent Labs Lab 05/31/15 1152 05/31/15 1641 06/01/15 0424  NA 129*  --  132*  K 3.2*  --  3.0*  CL 99*  --  108  CO2 21*  --  20*  GLUCOSE 217*  --  146*  BUN 7  --  6  CREATININE 1.03*  --  0.87  CALCIUM 8.6*  --  7.9*  MG  --  1.7  --   AST 24  --   --   ALT 13*  --   --   ALKPHOS 66  --   --   BILITOT 1.4*  --   --     Microbiology Results   Recent Results (from the past 240 hour(s))  Blood culture (routine x 2)     Status: None (Preliminary result)   Collection Time: 05/31/15 12:26 PM  Result Value Ref Range Status   Specimen Description BLOOD NUMBER ONE RIGHT AC  Final   Special Requests   Final    BOTTLES DRAWN AEROBIC AND ANAEROBIC AER 5ML ANA 2ML   Culture NO GROWTH 2 DAYS  Final   Report Status PENDING  Incomplete  Blood culture (routine x 2)     Status: None (Preliminary result)   Collection Time: 05/31/15 12:43 PM  Result Value Ref Range Status   Specimen Description BLOOD NUMBER 2 RIGHT HAND  Final   Special Requests   Final    BOTTLES DRAWN AEROBIC AND ANAEROBIC AER 7ML ANA 2ML   Culture NO GROWTH 2 DAYS  Final   Report Status PENDING  Incomplete    RADIOLOGY:  Dg Chest 2 View  05/31/2015  CLINICAL DATA:  40 year old female with shortness of breath chest congestion and fever. Initial encounter. Smoker. EXAM: CHEST  2 VIEW COMPARISON:  Chest CT and radiographs 01/12/2010 FINDINGS: Stable lung volumes. Mediastinal contours remain normal. Visualized tracheal air column is within normal limits. No pneumothorax or pulmonary edema. No pleural effusion or consolidation but there is confluent endplate like patchy bibasilar opacity. Widespread increased peribronchial lower lobe opacity was noted in 2011. No other confluent opacity. No acute osseous abnormality identified. IMPRESSION: Bibasilar pulmonary opacity is suspicious for recurrent lower lobe infection in this setting, but might reflect postinflammatory scarring. No pleural effusion. Electronically Signed    By: Genevie Ann M.D.   On: 05/31/2015 12:32   Dg Pelvis 1-2 Views  05/31/2015  CLINICAL DATA:  40 year old female with increasing bilateral hip pain today, greater on the left. No known injury. Initial encounter. EXAM: PELVIS - 1-2 VIEW COMPARISON:  None. FINDINGS: Portable AP view of the pelvis at 1450 hours. Bone mineralization is within normal limits. Pelvis intact. Femoral heads normally located. Hip joint spaces appear normal. Sacral ala and SI joints appear normal. Negative visualized lower lumbar spine. Normal visualized lower abdominal and pelvic visceral contours. Normal bowel gas pattern. IMPRESSION: Normal radiographic appearance of the pelvis. Electronically Signed   By: Genevie Ann M.D.   On: 05/31/2015 15:06     Management plans discussed with the patient, family and they are in agreement.  CODE STATUS:     Code Status Orders        Start     Ordered   05/31/15 1632  Full code   Continuous     05/31/15 1631    Code Status History    Date Active Date Inactive Code Status Order ID Comments User Context   This patient has a current code status but no historical code status.      TOTAL TIME TAKING CARE OF THIS PATIENT: 40 minutes.    Oluwatobi Visser M.D on 06/02/2015 at 11:44 AM  Between 7am to 6pm - Pager - [phone] After 6pm go to [email or web address] - password [email or web address] [facility]  [place] [facility]  Office  [phone]  CC: Primary care physician; No PCP Per Patient

## 2015-06-02 NOTE — Progress Notes (Signed)
Pharmacy Antibiotic Note  Jordan Bauer is a 40 y.o. female admitted on 05/31/2015 with sepsis, CAP.  Pharmacy has been consulted for Vancomycin and levofloxacin. Vancomycin d/c.   Plan: Continue Levaquin 750 mg iv q 24 hours.    Height: 5\' 1"  (154.9 cm) Weight: 141 lb (63.957 kg) IBW/kg (Calculated) : 47.8  Temp (24hrs), Avg:99.6 F (37.6 C), Min:98.3 F (36.8 C), Max:100.3 F (37.9 C)   Recent Labs Lab 05/31/15 1152 05/31/15 1412 05/31/15 1641 06/01/15 0424  WBC 18.0*  --   --  11.7*  CREATININE 1.03*  --   --  0.87  LATICACIDVEN  --  1.0 1.2  --     Estimated Creatinine Clearance: 72.9 mL/min (by C-G formula based on Cr of 0.87).    Allergies  Allergen Reactions  . Amoxicillin Anaphylaxis, Hives and Other (See Comments)    Has patient had a PCN reaction causing immediate rash, facial/tongue/throat swelling, SOB or lightheadedness with hypotension: Yes Has patient had a PCN reaction causing severe rash involving mucus membranes or skin necrosis: No Has patient had a PCN reaction that required hospitalization No Has patient had a PCN reaction occurring within the last 10 years: No If all of the above answers are "NO", then may proceed with Cephalosporin use.  . Aspirin Anaphylaxis  . Keflex [Cephalexin] Other (See Comments)    Reaction:  Unknown   . Latex Rash    Antimicrobials this admission:   Vancomycin 4/5 >> 4/6   Levofloxacin 4/5 >>   Dose adjustments this admission:   Microbiology results:  BCx:  NGTD   UCx:    Sputum:    MRSA PCR:   Thank you for allowing pharmacy to be a part of this patient's care.  Ulice Dash D 06/02/2015 11:29 AM

## 2015-06-02 NOTE — Plan of Care (Signed)
Problem: Education: Goal: Knowledge of [place] [facility] Education information/materials will improve Outcome: Progressing Po k this am k 3.0 possible discharge  Later  Afebrile this am  Problem: Fluid Volume: Goal: Ability to maintain a balanced intake and output will improve Outcome: Progressing Drinking well voiding pts has  Her period  Problem: Nutrition: Goal: Adequate nutrition will be maintained Outcome: Progressing tol well

## 2015-06-05 LAB — CULTURE, BLOOD (ROUTINE X 2)
CULTURE: NO GROWTH
Culture: NO GROWTH

## 2015-10-21 ENCOUNTER — Emergency Department: Payer: Medicaid Other

## 2015-10-21 ENCOUNTER — Encounter: Payer: Self-pay | Admitting: Emergency Medicine

## 2015-10-21 ENCOUNTER — Emergency Department
Admission: EM | Admit: 2015-10-21 | Discharge: 2015-10-21 | Disposition: A | Discharge status: Home / Self Care | Payer: Medicaid Other | Attending: Emergency Medicine | Admitting: Emergency Medicine

## 2015-10-21 DIAGNOSIS — N390 Urinary tract infection, site not specified: Secondary | ICD-10-CM | POA: Diagnosis not present

## 2015-10-21 DIAGNOSIS — S0083XA Contusion of other part of head, initial encounter: Secondary | ICD-10-CM

## 2015-10-21 DIAGNOSIS — S0093XA Contusion of unspecified part of head, initial encounter: Secondary | ICD-10-CM

## 2015-10-21 DIAGNOSIS — Z792 Long term (current) use of antibiotics: Secondary | ICD-10-CM | POA: Insufficient documentation

## 2015-10-21 DIAGNOSIS — S1093XA Contusion of unspecified part of neck, initial encounter: Secondary | ICD-10-CM | POA: Insufficient documentation

## 2015-10-21 DIAGNOSIS — S0990XA Unspecified injury of head, initial encounter: Secondary | ICD-10-CM | POA: Diagnosis present

## 2015-10-21 DIAGNOSIS — S40021A Contusion of right upper arm, initial encounter: Secondary | ICD-10-CM | POA: Diagnosis not present

## 2015-10-21 DIAGNOSIS — Y92512 Supermarket, store or market as the place of occurrence of the external cause: Secondary | ICD-10-CM | POA: Diagnosis not present

## 2015-10-21 DIAGNOSIS — J45909 Unspecified asthma, uncomplicated: Secondary | ICD-10-CM | POA: Diagnosis not present

## 2015-10-21 DIAGNOSIS — Y939 Activity, unspecified: Secondary | ICD-10-CM | POA: Insufficient documentation

## 2015-10-21 DIAGNOSIS — S60221A Contusion of right hand, initial encounter: Secondary | ICD-10-CM

## 2015-10-21 DIAGNOSIS — S20211A Contusion of right front wall of thorax, initial encounter: Secondary | ICD-10-CM

## 2015-10-21 DIAGNOSIS — S0003XA Contusion of scalp, initial encounter: Secondary | ICD-10-CM | POA: Insufficient documentation

## 2015-10-21 DIAGNOSIS — Z79899 Other long term (current) drug therapy: Secondary | ICD-10-CM | POA: Insufficient documentation

## 2015-10-21 DIAGNOSIS — Y999 Unspecified external cause status: Secondary | ICD-10-CM | POA: Insufficient documentation

## 2015-10-21 DIAGNOSIS — F17200 Nicotine dependence, unspecified, uncomplicated: Secondary | ICD-10-CM | POA: Diagnosis not present

## 2015-10-21 LAB — URINALYSIS COMPLETE WITH MICROSCOPIC (ARMC ONLY)
Bilirubin Urine: NEGATIVE
GLUCOSE, UA: NEGATIVE mg/dL
Ketones, ur: NEGATIVE mg/dL
Nitrite: NEGATIVE
PROTEIN: NEGATIVE mg/dL
Specific Gravity, Urine: 1.005 (ref 1.005–1.030)
pH: 5 (ref 5.0–8.0)

## 2015-10-21 LAB — POCT PREGNANCY, URINE: Preg Test, Ur: NEGATIVE

## 2015-10-21 MED ORDER — HYDROCODONE-ACETAMINOPHEN 5-325 MG PO TABS: 1.0000 | ORAL_TABLET | ORAL | 0 refills | Status: DC | PRN

## 2015-10-21 MED ORDER — SULFAMETHOXAZOLE-TRIMETHOPRIM 800-160 MG PO TABS: 1.0000 | ORAL_TABLET | Freq: Two times a day (BID) | ORAL | 0 refills | Status: DC
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
## 2015-10-21 NOTE — Discharge Instructions (Signed)
Begin taking antibiotics today. You will need to take these twice a day every day for 10 days. Ice and elevate as needed for pain and swelling. Tomorrow you have more bruises and painful areas and you do at present. You will need to follow-up with your primary care doctor or can no clinic if any continued problems. Return to the emergency room if any severe worsening of your symptoms.
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
## 2016-04-17 ENCOUNTER — Emergency Department
Admission: EM | Admit: 2016-04-17 | Discharge: 2016-04-17 | Disposition: A | Discharge status: Home / Self Care | Payer: Medicare Other | Attending: Emergency Medicine | Admitting: Emergency Medicine

## 2016-04-17 DIAGNOSIS — R509 Fever, unspecified: Secondary | ICD-10-CM | POA: Diagnosis not present

## 2016-04-17 DIAGNOSIS — R52 Pain, unspecified: Secondary | ICD-10-CM | POA: Insufficient documentation

## 2016-04-17 DIAGNOSIS — J45909 Unspecified asthma, uncomplicated: Secondary | ICD-10-CM | POA: Diagnosis not present

## 2016-04-17 DIAGNOSIS — Z79899 Other long term (current) drug therapy: Secondary | ICD-10-CM | POA: Diagnosis not present

## 2016-04-17 DIAGNOSIS — F17200 Nicotine dependence, unspecified, uncomplicated: Secondary | ICD-10-CM | POA: Insufficient documentation

## 2016-04-17 DIAGNOSIS — R0981 Nasal congestion: Secondary | ICD-10-CM | POA: Diagnosis not present

## 2016-04-17 DIAGNOSIS — R05 Cough: Secondary | ICD-10-CM | POA: Diagnosis not present

## 2016-04-17 DIAGNOSIS — J3489 Other specified disorders of nose and nasal sinuses: Secondary | ICD-10-CM | POA: Diagnosis not present

## 2016-04-17 DIAGNOSIS — J111 Influenza due to unidentified influenza virus with other respiratory manifestations: Secondary | ICD-10-CM

## 2016-04-17 DIAGNOSIS — R69 Illness, unspecified: Secondary | ICD-10-CM

## 2016-04-17 MED ORDER — OSELTAMIVIR PHOSPHATE 75 MG PO CAPS: 75.0000 mg | ORAL_CAPSULE | Freq: Two times a day (BID) | ORAL | 0 refills | Status: AC

## 2016-04-17 MED ORDER — IBUPROFEN 800 MG PO TABS
800.0000 mg | ORAL_TABLET | Freq: Once | ORAL | Status: AC
  Administered 2016-04-17: 800 mg via ORAL
  Filled 2016-04-17: qty 1

## 2016-04-17 NOTE — ED Notes (Signed)
See triage note  States she developed sore throat with some body aches 2 days ago  Low grade temp on arrival

## 2016-04-17 NOTE — ED Provider Notes (Signed)
[place] Provider Note   CSN: [id number] Arrival date & time: 04/17/16  1521     History   Chief Complaint Chief Complaint  Patient presents with  . Generalized Body Aches    HPI Jordan Bauer is a 41 y.o. female presents to the emergency department for evaluation of fever chills and body aches. Patient developed sudden onset of fever chills and body aches last night. She had a temperature 12.1. She's also had a nonproductive cough and runny nose, congestion. She denies any nausea vomiting diarrhea or abdominal pain. She has not had much of an appetite but is tolerating by mouth fluids. She has not taken any Tylenol or ibuprofen. Temperature in triage 99.9. She denies any headaches, neck pain.  HPI  Past Medical History:  Diagnosis Date  . Asthma   . Seizures ([place])   . Sickle cell trait [facility])     Patient Active Problem List   Diagnosis Date Noted  . Sepsis ([place]) 05/31/2015  . Community acquired pneumonia 05/31/2015    Past Surgical History:  Procedure Laterality Date  . TONSILLECTOMY    . TUBAL LIGATION      OB History    No data available       Home Medications    Prior to Admission medications   Medication Sig Start Date End Date Taking? Authorizing Provider  albuterol (PROVENTIL HFA;VENTOLIN HFA) 108 (90 BASE) MCG/ACT inhaler Inhale 2 puffs into the lungs every 6 (six) hours as needed for wheezing or shortness of breath.    Historical Provider, MD  guaiFENesin (ROBITUSSIN) 100 MG/5ML SOLN Take 5 mLs (100 mg total) by mouth every 4 (four) hours as needed for cough or to loosen phlegm. 06/02/15   Fritzi Mandes, MD  HYDROcodone-acetaminophen (NORCO/VICODIN) 5-325 MG tablet Take 1 tablet by mouth every 4 (four) hours as needed for moderate pain. 10/21/15   Johnn Hai, PA-C  levofloxacin (LEVAQUIN) 750 MG tablet Take 1 tablet (750 mg total) by mouth daily. 06/02/15   Fritzi Mandes, MD  oseltamivir (TAMIFLU) 75 MG capsule Take 1 capsule (75 mg  total) by mouth 2 (two) times daily. 04/17/16 04/22/16  Duanne Guess, PA-C  risperiDONE (RISPERDAL) 1 MG tablet Take 1 mg by mouth at bedtime.    Historical Provider, MD  sertraline (ZOLOFT) 50 MG tablet Take 50 mg by mouth at bedtime.    Historical Provider, MD  sulfamethoxazole-trimethoprim (BACTRIM DS,SEPTRA DS) 800-160 MG tablet Take 1 tablet by mouth 2 (two) times daily. 10/21/15   Johnn Hai, PA-C  topiramate (TOPAMAX) 100 MG tablet Take 100 mg by mouth 2 (two) times daily.    Historical Provider, MD    Family History Family History  Problem Relation Age of Onset  . Diabetes Mother   . Sickle cell trait Mother   . Heart attack Father   . Heart attack Brother   . Sickle cell anemia Other     Social History Social History  Substance Use Topics  . Smoking status: Heavy Tobacco Smoker    Packs/day: 0.50    Years: 20.00  . Smokeless tobacco: Never Used  . Alcohol use Yes     Comment: Occasionally.     Allergies   Amoxicillin; Aspirin; Keflex [cephalexin]; and Latex   Review of Systems Review of Systems  Constitutional: Positive for chills and fever. Negative for activity change and fatigue.  HENT: Positive for congestion. Negative for sinus pressure and sore throat.   Eyes: Negative for visual disturbance.  Respiratory:  Positive for cough. Negative for chest tightness, shortness of breath and wheezing.   Cardiovascular: Negative for chest pain and leg swelling.  Gastrointestinal: Negative for abdominal pain, diarrhea, nausea and vomiting.  Genitourinary: Negative for dysuria.  Musculoskeletal: Negative for arthralgias and gait problem.  Skin: Negative for rash.  Neurological: Negative for weakness, numbness and headaches.  Hematological: Negative for adenopathy.  Psychiatric/Behavioral: Negative for agitation, behavioral problems and confusion.     Physical Exam Updated Vital Signs BP 116/79 (BP Location: Left Arm)   Pulse (!) 113   Temp 99.9 F (37.7 C)  (Oral)   Resp 18   Ht 5\' 2"  (1.575 m)   Wt 64.4 kg   LMP 03/19/2016 (Approximate)   SpO2 99%   BMI 25.97 kg/m   Physical Exam  Constitutional: She appears well-developed and well-nourished. No distress.  HENT:  Head: Normocephalic and atraumatic.  Right Ear: External ear normal.  Left Ear: External ear normal.  Nose: Rhinorrhea present.  Mouth/Throat: Oropharynx is clear and moist. No oral lesions. No trismus in the jaw. No oropharyngeal exudate or tonsillar abscesses.  Eyes: Conjunctivae are normal.  Neck: Normal range of motion. Neck supple.  Cardiovascular: Normal rate and regular rhythm.   No murmur heard. Pulmonary/Chest: Effort normal and breath sounds normal. No respiratory distress. She has no wheezes.  Abdominal: Soft. She exhibits no distension. There is no tenderness.  Musculoskeletal: She exhibits no edema.  Lymphadenopathy:    She has cervical adenopathy (positive posterior cervical lymphadenopathy).  Neurological: She is alert.  Skin: Skin is warm and dry.  Psychiatric: She has a normal mood and affect.  Nursing note and vitals reviewed.    ED Treatments / Results  Labs (all labs ordered are listed, but only abnormal results are displayed) Labs Reviewed - No data to display  EKG  EKG Interpretation None       Radiology No results found.  Procedures Procedures (including critical care time)  Medications Ordered in ED Medications  ibuprofen (ADVIL,MOTRIN) tablet 800 mg (not administered)     Initial Impression / Assessment and Plan / ED Course  I have reviewed the triage vital signs and the nursing notes.  Pertinent labs & imaging results that were available during my care of the patient were reviewed by me and considered in my medical decision making (see chart for details).   41 year old female with sudden onset fever, chills, body aches, cold symptoms. We will treat with Tamiflu. She is educated on signs and symptoms to return to the  emergency department for. She will increase her fluids and alternate Tylenol and ibuprofen for fevers chills and body aches. She will take over-the-counter cold and flu medications as discussed, she will make sure that there is no Tylenol or ibuprofen within the common duration: Flu meds.  Final Clinical Impressions(s) / ED Diagnoses   Final diagnoses:  Influenza-like illness  Body aches    New Prescriptions New Prescriptions   OSELTAMIVIR (TAMIFLU) 75 MG CAPSULE    Take 1 capsule (75 mg total) by mouth 2 (two) times daily.     Duanne Guess, PA-C 04/17/16 [place], MD 04/17/16 [phone]

## 2016-04-17 NOTE — Discharge Instructions (Signed)
Please alternate Tylenol and ibuprofen as needed for fevers. Make sure you drinking lots of fluids. Take over-the-counter cold and flu medication as discussed. Return to the ER for any worsening symptoms such as fevers above 102.1 that are not responding to Tylenol and ibuprofen, chest pain, shortness of breath, worsening symptoms urgent changes in her health.

## 2016-04-17 NOTE — ED Triage Notes (Signed)
Body aches, headache and fever that began yesterday. Pt alert and oriented X4, active, cooperative, pt in NAD. RR even and unlabored, color WNL.

## 2016-08-30 ENCOUNTER — Emergency Department: Payer: Medicare Other

## 2016-08-30 ENCOUNTER — Emergency Department
Admission: EM | Admit: 2016-08-30 | Discharge: 2016-08-30 | Disposition: A | Discharge status: Home / Self Care | Payer: Medicare Other | Attending: Emergency Medicine | Admitting: Emergency Medicine

## 2016-08-30 ENCOUNTER — Encounter: Payer: Self-pay | Admitting: Medical Oncology

## 2016-08-30 DIAGNOSIS — J40 Bronchitis, not specified as acute or chronic: Secondary | ICD-10-CM | POA: Insufficient documentation

## 2016-08-30 DIAGNOSIS — Z862 Personal history of diseases of the blood and blood-forming organs and certain disorders involving the immune mechanism: Secondary | ICD-10-CM | POA: Insufficient documentation

## 2016-08-30 DIAGNOSIS — Z79899 Other long term (current) drug therapy: Secondary | ICD-10-CM | POA: Insufficient documentation

## 2016-08-30 DIAGNOSIS — J069 Acute upper respiratory infection, unspecified: Secondary | ICD-10-CM | POA: Insufficient documentation

## 2016-08-30 DIAGNOSIS — Z91040 Latex allergy status: Secondary | ICD-10-CM | POA: Diagnosis not present

## 2016-08-30 DIAGNOSIS — F17200 Nicotine dependence, unspecified, uncomplicated: Secondary | ICD-10-CM | POA: Diagnosis not present

## 2016-08-30 DIAGNOSIS — R079 Chest pain, unspecified: Secondary | ICD-10-CM | POA: Diagnosis not present

## 2016-08-30 DIAGNOSIS — R0602 Shortness of breath: Secondary | ICD-10-CM | POA: Diagnosis present

## 2016-08-30 LAB — LACTIC ACID, PLASMA: Lactic Acid, Venous: 1.1 mmol/L (ref 0.5–1.9)

## 2016-08-30 LAB — COMPREHENSIVE METABOLIC PANEL
ALT: 16 U/L (ref 14–54)
ANION GAP: 9 (ref 5–15)
AST: 21 U/L (ref 15–41)
Albumin: 4.4 g/dL (ref 3.5–5.0)
Alkaline Phosphatase: 63 U/L (ref 38–126)
BUN: 16 mg/dL (ref 6–20)
CHLORIDE: 106 mmol/L (ref 101–111)
CO2: 21 mmol/L — AB (ref 22–32)
CREATININE: 0.85 mg/dL (ref 0.44–1.00)
Calcium: 9.4 mg/dL (ref 8.9–10.3)
GFR calc non Af Amer: >60 mL/min (ref >60)
Glucose, Bld: 131 mg/dL — ABNORMAL HIGH (ref 65–99)
POTASSIUM: 3.5 mmol/L (ref 3.5–5.1)
SODIUM: 136 mmol/L (ref 135–145)
Total Bilirubin: 0.8 mg/dL (ref 0.3–1.2)
Total Protein: 8.1 g/dL (ref 6.5–8.1)

## 2016-08-30 LAB — CBC WITH DIFFERENTIAL/PLATELET
Basophils Absolute: 0.1 10*3/uL (ref 0–0.1)
Basophils Relative: 1 %
Eosinophils Absolute: 0 10*3/uL (ref 0–0.7)
Eosinophils Relative: 0 %
HEMATOCRIT: 39.3 % (ref 35.0–47.0)
HEMOGLOBIN: 13.3 g/dL (ref 12.0–16.0)
LYMPHS ABS: 2.4 10*3/uL (ref 1.0–3.6)
LYMPHS PCT: 22 %
MCH: 32 pg (ref 26.0–34.0)
MCHC: 33.8 g/dL (ref 32.0–36.0)
MCV: 94.7 fL (ref 80.0–100.0)
Monocytes Absolute: 0.6 10*3/uL (ref 0.2–0.9)
Monocytes Relative: 6 %
NEUTROS ABS: 7.8 10*3/uL — AB (ref 1.4–6.5)
NEUTROS PCT: 71 %
Platelets: 334 10*3/uL (ref 150–440)
RBC: 4.15 MIL/uL (ref 3.80–5.20)
RDW: 14.8 % — ABNORMAL HIGH (ref 11.5–14.5)
WBC: 10.8 10*3/uL (ref 3.6–11.0)

## 2016-08-30 LAB — PROTIME-INR
INR: 0.92
Prothrombin Time: 12.4 seconds (ref 11.4–15.2)

## 2016-08-30 MED ORDER — HYDROCOD POLST-CPM POLST ER 10-8 MG/5ML PO SUER
5.0000 mL | Freq: Once | ORAL | Status: AC
  Administered 2016-08-30: 5 mL via ORAL
  Filled 2016-08-30: qty 5

## 2016-08-30 MED ORDER — GUAIFENESIN-CODEINE 100-10 MG/5ML PO SOLN: 5.0000 mL | Freq: Four times a day (QID) | ORAL | 0 refills | Status: DC | PRN

## 2016-08-30 MED ORDER — AZITHROMYCIN 250 MG PO TABS: ORAL_TABLET | ORAL | 0 refills | Status: AC

## 2016-08-30 MED ORDER — MORPHINE SULFATE (PF) 4 MG/ML IV SOLN
4.0000 mg | Freq: Once | INTRAVENOUS | Status: AC
  Administered 2016-08-30: 4 mg via INTRAVENOUS
  Filled 2016-08-30: qty 1

## 2016-08-30 NOTE — ED Notes (Signed)
Patient calling for a ride home. Patient instructed she is not to drive after having pain medication. Patient verbalizes understanding.

## 2016-08-30 NOTE — ED Notes (Signed)
Patient transported to radiology at this time. 

## 2016-08-30 NOTE — ED Provider Notes (Signed)
[facility] Emergency Department Provider Note  Time seen: 1:05 PM  I have reviewed the triage vital signs and the nursing notes.   HISTORY  Chief Complaint Shortness of Breath; Chest Pain; Chills; and Generalized Body Aches    HPI Jordan Bauer is a 41 y.o. female with a past medical history of asthma, seizure disorder, sickle cell trait, presents to the emergency department for cough and congestion. According to the patient for the past 3 days she has been coughing with congestion, states chills at home has not measured a temperature. The patient is complaining of pain all over including all of her arms or legs her joints, her chest, back and abdomen. Patient states a history of sickle cell, however according to her record review the patient has sickle cell trait not sickle cell disease. Patient states she was supposed to see a hematologist but never did. Patient describes her pain as moderate to severe located all over. When asked specifically if she has chest pain she states yes, but also states pain in her arms, legs, back, head and abdomen. Patient does state increased cough over the past 3 days with nasal congestion.  Past Medical History:  Diagnosis Date  . Asthma   . Seizures ([place])   . Sickle cell trait [facility])     Patient Active Problem List   Diagnosis Date Noted  . Sepsis ([place]) 05/31/2015  . Community acquired pneumonia 05/31/2015    Past Surgical History:  Procedure Laterality Date  . TONSILLECTOMY    . TUBAL LIGATION      Prior to Admission medications   Medication Sig Start Date End Date Taking? Authorizing Provider  albuterol (PROVENTIL HFA;VENTOLIN HFA) 108 (90 BASE) MCG/ACT inhaler Inhale 2 puffs into the lungs every 6 (six) hours as needed for wheezing or shortness of breath.    [provider]  guaiFENesin (ROBITUSSIN) 100 MG/5ML SOLN Take 5 mLs (100 mg total) by mouth every 4 (four) hours as needed for cough or to loosen  phlegm. 06/02/15   Fritzi Mandes, MD  HYDROcodone-acetaminophen (NORCO/VICODIN) 5-325 MG tablet Take 1 tablet by mouth every 4 (four) hours as needed for moderate pain. 10/21/15   Johnn Hai, PA-C  levofloxacin (LEVAQUIN) 750 MG tablet Take 1 tablet (750 mg total) by mouth daily. 06/02/15   Fritzi Mandes, MD  risperiDONE (RISPERDAL) 1 MG tablet Take 1 mg by mouth at bedtime.    [provider]  sertraline (ZOLOFT) 50 MG tablet Take 50 mg by mouth at bedtime.    [provider]  sulfamethoxazole-trimethoprim (BACTRIM DS,SEPTRA DS) 800-160 MG tablet Take 1 tablet by mouth 2 (two) times daily. 10/21/15   Johnn Hai, PA-C  topiramate (TOPAMAX) 100 MG tablet Take 100 mg by mouth 2 (two) times daily.    [provider]    Allergies  Allergen Reactions  . Amoxicillin Anaphylaxis, Hives and Other (See Comments)    Has patient had a PCN reaction causing immediate rash, facial/tongue/throat swelling, SOB or lightheadedness with hypotension: Yes Has patient had a PCN reaction causing severe rash involving mucus membranes or skin necrosis: No Has patient had a PCN reaction that required hospitalization No Has patient had a PCN reaction occurring within the last 10 years: No If all of the above answers are "NO", then may proceed with Cephalosporin use.  . Aspirin Anaphylaxis  . Keflex [Cephalexin] Other (See Comments)    Reaction:  Unknown   . Latex Rash    Family History  Problem Relation Age of Onset  . Diabetes Mother   . Sickle cell trait Mother   . Heart attack Father   . Heart attack Brother   . Sickle cell anemia Other     Social History Social History  Substance Use Topics  . Smoking status: Heavy Tobacco Smoker    Packs/day: 0.50    Years: 20.00  . Smokeless tobacco: Never Used  . Alcohol use Yes     Comment: Occasionally.    Review of Systems Constitutional: Negative for measured fever. Positive for chills at home. ENT: Positive for  congestion. Cardiovascular: Positive for chest pain. Respiratory: Positive for shortness of breath with cough. Gastrointestinal: Positive for abdominal pain. She describes as muscle cramps. Negative for vomiting or diarrhea. Genitourinary: Negative for dysuria. Musculoskeletal: Positive for arm pain, leg pain, pain in all of her muscles, all of her joints. Skin: Negative for rash. Neurological: Positive for headache. Denies focal weakness or numbness. All other ROS negative  ____________________________________________   PHYSICAL EXAM:  VITAL SIGNS: ED Triage Vitals  Enc Vitals Group     BP 08/30/16 1220 134/88     Pulse Rate 08/30/16 1220 (!) 107     Resp 08/30/16 1220 (!) 24     Temp 08/30/16 1220 99.2 F (37.3 C)     Temp Source 08/30/16 1220 Oral     SpO2 08/30/16 1220 96 %     Weight 08/30/16 1221 142 lb (64.4 kg)     Height --      Head Circumference --      Peak Flow --      Pain Score 08/30/16 1220 7     Pain Loc --      Pain Edu? --      Excl. in [place]? --     Constitutional: Alert and oriented. Well appearing and in no distress. Eyes: Normal exam ENT   Head: Normocephalic and atraumatic.   Nose: Mild congestion   Mouth/Throat: Mucous membranes are moist. Cardiovascular: Normal rate, regular rhythm. No murmur Respiratory: Normal respiratory effort without tachypnea nor retractions. Breath sounds are clear. No obvious wheezes rales or rhonchi on exam. Patient does cough at times during deep inspirations. Gastrointestinal: Soft and nontender. No distention.  No focal tenderness identified during abdominal exam Musculoskeletal: Patient states pain with palpation of arms and legs. States swelling but none appreciated on exam. Neurologic:  Normal speech and language. No gross focal neurologic deficits Skin:  Skin is warm, dry and intact.  Psychiatric: Mood and affect are normal.   ____________________________________________    EKG  EKG reviewed and  interpreted by myself shows sinus tachycardia 107 bpm, narrow QRS, normal axis, normal intervals, nonspecific ST changes.  ____________________________________________    RADIOLOGY  Chronic scarring without acute infection.  ____________________________________________   INITIAL IMPRESSION / ASSESSMENT AND PLAN / ED COURSE  Pertinent labs & imaging results that were available during my care of the patient were reviewed by me and considered in my medical decision making (see chart for details).  Patient presents to the emergency department with cough and congestion. Patient does have a low-grade temperature 99.2 in the emergency department, she is tachypneic at times but only while coughing. Otherwise she appears calm and comfortable. 98% room air saturation. Patient states she has sickle cell, however according to record review she has sickle cell trait. We will check labs, chest x-ray and continue to closely monitor.  Patient's labs are normal. Vitals are now normal. Patient appears well but  does have a somewhat frequent cough during exam. Given the patient's cough with low-grade temperature we'll cover with Zithromax for likely acute bronchitis as well as cough medication. Patient agreeable to plan. I discussed return precautions with the patient.  ____________________________________________   FINAL CLINICAL IMPRESSION(S) / ED DIAGNOSES  Upper respiratory infection Acute bronchitis   Harvest Dark, MD 08/30/16 1538

## 2016-08-30 NOTE — ED Triage Notes (Signed)
Pt reports that for the past 3 days she has been having sob, chills, chest pain that radiates into her back. Pt arrives and is diaphoretic and tachypnea. Pt has sickle cell and is c/o pain all over.

## 2016-09-04 LAB — CULTURE, BLOOD (ROUTINE X 2)
CULTURE: NO GROWTH
Culture: NO GROWTH

## 2017-05-21 ENCOUNTER — Other Ambulatory Visit: Payer: Self-pay

## 2017-05-21 ENCOUNTER — Emergency Department: Payer: Medicare Other

## 2017-05-21 ENCOUNTER — Encounter: Payer: Self-pay | Admitting: Emergency Medicine

## 2017-05-21 ENCOUNTER — Emergency Department
Admission: EM | Admit: 2017-05-21 | Discharge: 2017-05-22 | Disposition: A | Discharge status: Home / Self Care | Payer: Medicare Other | Attending: Emergency Medicine | Admitting: Emergency Medicine

## 2017-05-21 DIAGNOSIS — M79671 Pain in right foot: Secondary | ICD-10-CM

## 2017-05-21 DIAGNOSIS — Z91040 Latex allergy status: Secondary | ICD-10-CM | POA: Insufficient documentation

## 2017-05-21 DIAGNOSIS — S93401A Sprain of unspecified ligament of right ankle, initial encounter: Secondary | ICD-10-CM | POA: Diagnosis not present

## 2017-05-21 DIAGNOSIS — Y9383 Activity, rough housing and horseplay: Secondary | ICD-10-CM | POA: Diagnosis not present

## 2017-05-21 DIAGNOSIS — F17200 Nicotine dependence, unspecified, uncomplicated: Secondary | ICD-10-CM | POA: Diagnosis not present

## 2017-05-21 DIAGNOSIS — S93601A Unspecified sprain of right foot, initial encounter: Secondary | ICD-10-CM | POA: Insufficient documentation

## 2017-05-21 DIAGNOSIS — Y929 Unspecified place or not applicable: Secondary | ICD-10-CM | POA: Diagnosis not present

## 2017-05-21 DIAGNOSIS — X58XXXA Exposure to other specified factors, initial encounter: Secondary | ICD-10-CM | POA: Insufficient documentation

## 2017-05-21 DIAGNOSIS — Y999 Unspecified external cause status: Secondary | ICD-10-CM | POA: Diagnosis not present

## 2017-05-21 DIAGNOSIS — Z79899 Other long term (current) drug therapy: Secondary | ICD-10-CM | POA: Insufficient documentation

## 2017-05-21 DIAGNOSIS — J45909 Unspecified asthma, uncomplicated: Secondary | ICD-10-CM | POA: Insufficient documentation

## 2017-05-21 DIAGNOSIS — S99911A Unspecified injury of right ankle, initial encounter: Secondary | ICD-10-CM | POA: Diagnosis present

## 2017-05-21 NOTE — ED Triage Notes (Signed)
Pt to triage via w/c with no distress noted; pt reports "horseplaying" and injured right foot

## 2017-05-22 DIAGNOSIS — S93401A Sprain of unspecified ligament of right ankle, initial encounter: Secondary | ICD-10-CM | POA: Diagnosis not present

## 2017-05-22 MED ORDER — OXYCODONE-ACETAMINOPHEN 5-325 MG PO TABS: 1.0000 | ORAL_TABLET | ORAL | 0 refills | Status: DC | PRN

## 2017-05-22 MED ORDER — OXYCODONE-ACETAMINOPHEN 5-325 MG PO TABS
1.0000 | ORAL_TABLET | Freq: Once | ORAL | Status: AC
  Administered 2017-05-22: 1 via ORAL
  Filled 2017-05-22: qty 1

## 2017-05-22 NOTE — Discharge Instructions (Signed)
1.  You may take Percocet as needed for pain. 2.  Elevate affected area and apply ice several times daily. 3.  Use crutches to walk.  You may bear weight on affected leg as tolerated. 4.  Return to the ER for worsening symptoms, increased pain or swelling, or other concerns.

## 2017-05-22 NOTE — ED Notes (Signed)

## 2017-05-22 NOTE — ED Provider Notes (Signed)
[facility] Emergency Department Provider Note   ____________________________________________   First MD Initiated Contact with Patient 05/22/17 0013     (approximate)  I have reviewed the triage vital signs and the nursing notes.   HISTORY  Chief Complaint Foot Pain    HPI Jordan Bauer is a 42 y.o. female who presents to the ED from home with a chief complaint of right foot and ankle pain/injury.  Reports she was "horsing around" approximately 3 hours ago when she twisted her right foot and ankle.  Denies striking head or LOC.  Denies extremity weakness, numbness or tingling.  Voices no other complaints.   Past Medical History:  Diagnosis Date  . Asthma   . Seizures ([place])   . Sickle cell trait [facility])     Patient Active Problem List   Diagnosis Date Noted  . Sepsis ([place]) 05/31/2015  . Community acquired pneumonia 05/31/2015    Past Surgical History:  Procedure Laterality Date  . TONSILLECTOMY    . TUBAL LIGATION      Prior to Admission medications   Medication Sig Start Date End Date Taking? Authorizing Provider  albuterol (PROVENTIL HFA;VENTOLIN HFA) 108 (90 BASE) MCG/ACT inhaler Inhale 2 puffs into the lungs every 6 (six) hours as needed for wheezing or shortness of breath.    [provider]  guaiFENesin (ROBITUSSIN) 100 MG/5ML SOLN Take 5 mLs (100 mg total) by mouth every 4 (four) hours as needed for cough or to loosen phlegm. Patient not taking: Reported on 08/30/2016 06/02/15   Fritzi Mandes, MD  guaiFENesin-codeine 100-10 MG/5ML syrup Take 5 mLs by mouth every 6 (six) hours as needed for cough. 08/30/16   Harvest Dark, MD  HYDROcodone-acetaminophen (NORCO/VICODIN) 5-325 MG tablet Take 1 tablet by mouth every 4 (four) hours as needed for moderate pain. Patient not taking: Reported on 08/30/2016 10/21/15   Letitia Neri L, PA-C  QUEtiapine Fumarate (SEROQUEL PO) Take by mouth at bedtime.    [provider]    risperiDONE (RISPERDAL) 1 MG tablet Take 1 mg by mouth at bedtime.    [provider]  sertraline (ZOLOFT) 50 MG tablet Take 50 mg by mouth at bedtime.    [provider]  topiramate (TOPAMAX) 100 MG tablet Take 100 mg by mouth 2 (two) times daily.    [provider]    Allergies Amoxicillin; Aspirin; Keflex [cephalexin]; and Latex  Family History  Problem Relation Age of Onset  . Diabetes Mother   . Sickle cell trait Mother   . Heart attack Father   . Heart attack Brother   . Sickle cell anemia Other     Social History Social History   Tobacco Use  . Smoking status: Heavy Tobacco Smoker    Packs/day: 0.50    Years: 20.00    Pack years: 10.00  . Smokeless tobacco: Never Used  Substance Use Topics  . Alcohol use: Yes    Comment: Occasionally.  . Drug use: Yes    Types: Marijuana    Review of Systems  Constitutional: No fever/chills. Eyes: No visual changes. ENT: No sore throat. Cardiovascular: Denies chest pain. Respiratory: Denies shortness of breath. Gastrointestinal: No abdominal pain.  No nausea, no vomiting.  No diarrhea.  No constipation. Genitourinary: Negative for dysuria. Musculoskeletal: Positive for right foot/ankle pain/injury.  Negative for back pain. Skin: Negative for rash. Neurological: Negative for headaches, focal weakness or numbness.   ____________________________________________   PHYSICAL EXAM:  VITAL SIGNS: ED Triage Vitals  Enc Vitals Group     BP 05/21/17 2221 114/72     Pulse Rate 05/21/17 2221 (!) 107     Resp 05/21/17 2221 20     Temp 05/21/17 2221 99.6 F (37.6 C)     Temp Source 05/21/17 2221 Oral     SpO2 05/21/17 2221 99 %     Weight 05/21/17 2220 146 lb (66.2 kg)     Height 05/21/17 2220 5' 2.5" (1.588 m)     Head Circumference --      Peak Flow --      Pain Score 05/21/17 2226 10     Pain Loc --      Pain Edu? --      Excl. in [place]? --     Constitutional: Alert and oriented. Well  appearing and in no acute distress. Eyes: Conjunctivae are normal. PERRL. EOMI. Head: Atraumatic. Nose: No congestion/rhinnorhea. Mouth/Throat: Mucous membranes are moist.  Oropharynx non-erythematous. Neck: No stridor.  No cervical spine tenderness to palpation. Cardiovascular: Normal rate, regular rhythm. Grossly normal heart sounds.  Good peripheral circulation. Respiratory: Normal respiratory effort.  No retractions. Lungs CTAB. Gastrointestinal: Soft and nontender. No distention. No abdominal bruits. No CVA tenderness. Musculoskeletal: Right foot tender to palpation from sole into the medial malleolus and dorsal midfoot.  Limited range of motion secondary to pain.  2+ distal pulses.  Brisk, less than 5-second capillary refill. Neurologic:  Normal speech and language. No gross focal neurologic deficits are appreciated.  Skin:  Skin is warm, dry and intact. No rash noted. Psychiatric: Mood and affect are normal. Speech and behavior are normal.  ____________________________________________   LABS (all labs ordered are listed, but only abnormal results are displayed)  Labs Reviewed - No data to display ____________________________________________  EKG  None ____________________________________________  RADIOLOGY  ED MD interpretation: No acute traumatic injuries  Official radiology report(s): Dg Ankle Complete Right  Result Date: 05/21/2017 CLINICAL DATA:  RIGHT foot and ankle injury tonight. EXAM: RIGHT FOOT COMPLETE - 3+ VIEW; RIGHT ANKLE - COMPLETE 3+ VIEW COMPARISON:  None. FINDINGS: RIGHT foot: There is no evidence of fracture or dislocation. Marginal spurring and subchondral cysts medial midfoot. Soft tissues are unremarkable. RIGHT ankle: No fracture deformity nor dislocation. The ankle mortise appears congruent and the tibiofibular syndesmosis intact. No destructive bony lesions. Soft tissue planes are non-suspicious. IMPRESSION: No acute osseous process. 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 Electronically Signed   By: Elon Alas M.D.   On: 05/21/2017 22:56   Dg Foot Complete Right  Result Date: 05/21/2017 CLINICAL DATA:  RIGHT foot and ankle injury tonight. EXAM: RIGHT FOOT COMPLETE - 3+ VIEW; RIGHT ANKLE - COMPLETE 3+ VIEW COMPARISON:  None. FINDINGS: RIGHT foot: There is no evidence of fracture or dislocation. Marginal spurring and subchondral cysts medial midfoot. Soft tissues are unremarkable. RIGHT ankle: No fracture deformity nor dislocation. The ankle mortise appears congruent and the tibiofibular syndesmosis intact. No destructive bony lesions. Soft tissue planes are non-suspicious. IMPRESSION: No acute osseous process. 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 Electronically Signed   By: Elon Alas M.D.   On: 05/21/2017 22:56    ____________________________________________   PROCEDURES  Procedure(s) performed: None  Procedures  Critical Care performed: No  ____________________________________________   INITIAL IMPRESSION / ASSESSMENT AND PLAN / ED COURSE  As part of my medical decision making, I reviewed the following data within the [place] History obtained from family, Nursing notes reviewed and incorporated, Radiograph reviewed and Notes  from prior ED visits   42 year old female who presents with right foot and ankle sprain.  Will place on analgesia, podiatric shoe, ankle stirrup splint, crutches as needed and patient will follow-up with orthopedics next week.  Strict return precautions given.  Patient and family member verbalize understanding and agree with plan of care.      ____________________________________________   FINAL CLINICAL IMPRESSION(S) / ED DIAGNOSES  Final diagnoses:  Foot pain, right  Sprain of right foot, initial encounter  Sprain of right ankle, unspecified ligament, initial encounter     ED Discharge Orders    None        Note:  This document was prepared using Dragon voice recognition software and may include unintentional dictation errors.    Paulette Blanch, MD 05/22/17 [phone]

## 2018-11-23 ENCOUNTER — Emergency Department
Admission: EM | Admit: 2018-11-23 | Discharge: 2018-11-23 | Disposition: A | Discharge status: Home / Self Care | Payer: Medicare HMO | Attending: Emergency Medicine | Admitting: Emergency Medicine

## 2018-11-23 ENCOUNTER — Other Ambulatory Visit: Payer: Self-pay

## 2018-11-23 ENCOUNTER — Emergency Department: Payer: Medicare HMO

## 2018-11-23 ENCOUNTER — Encounter: Payer: Self-pay | Admitting: Emergency Medicine

## 2018-11-23 DIAGNOSIS — D573 Sickle-cell trait: Secondary | ICD-10-CM | POA: Insufficient documentation

## 2018-11-23 DIAGNOSIS — J45909 Unspecified asthma, uncomplicated: Secondary | ICD-10-CM | POA: Insufficient documentation

## 2018-11-23 DIAGNOSIS — Z881 Allergy status to other antibiotic agents status: Secondary | ICD-10-CM | POA: Diagnosis not present

## 2018-11-23 DIAGNOSIS — N390 Urinary tract infection, site not specified: Secondary | ICD-10-CM | POA: Diagnosis not present

## 2018-11-23 DIAGNOSIS — Z79899 Other long term (current) drug therapy: Secondary | ICD-10-CM | POA: Insufficient documentation

## 2018-11-23 DIAGNOSIS — R569 Unspecified convulsions: Secondary | ICD-10-CM | POA: Diagnosis not present

## 2018-11-23 DIAGNOSIS — Z91040 Latex allergy status: Secondary | ICD-10-CM | POA: Insufficient documentation

## 2018-11-23 DIAGNOSIS — R0602 Shortness of breath: Secondary | ICD-10-CM | POA: Diagnosis not present

## 2018-11-23 DIAGNOSIS — M542 Cervicalgia: Secondary | ICD-10-CM | POA: Diagnosis not present

## 2018-11-23 DIAGNOSIS — Z886 Allergy status to analgesic agent status: Secondary | ICD-10-CM | POA: Diagnosis not present

## 2018-11-23 DIAGNOSIS — Z880 Allergy status to penicillin: Secondary | ICD-10-CM | POA: Insufficient documentation

## 2018-11-23 DIAGNOSIS — F17210 Nicotine dependence, cigarettes, uncomplicated: Secondary | ICD-10-CM | POA: Insufficient documentation

## 2018-11-23 HISTORY — DX: Disorder of thyroid, unspecified: E07.9

## 2018-11-23 LAB — COMPREHENSIVE METABOLIC PANEL
ALT: 22 U/L (ref 0–44)
AST: 29 U/L (ref 15–41)
Albumin: 3.9 g/dL (ref 3.5–5.0)
Alkaline Phosphatase: 60 U/L (ref 38–126)
Anion gap: 10 (ref 5–15)
BUN: 14 mg/dL (ref 6–20)
CO2: 19 mmol/L — ABNORMAL LOW (ref 22–32)
Calcium: 9.1 mg/dL (ref 8.9–10.3)
Chloride: 106 mmol/L (ref 98–111)
Creatinine, Ser: 0.78 mg/dL (ref 0.44–1.00)
GFR calc Af Amer: >60 mL/min (ref >60)
GFR calc non Af Amer: >60 mL/min (ref >60)
Glucose, Bld: 106 mg/dL — ABNORMAL HIGH (ref 70–99)
Potassium: 4.3 mmol/L (ref 3.5–5.1)
Sodium: 135 mmol/L (ref 135–145)
Total Bilirubin: 1.1 mg/dL (ref 0.3–1.2)
Total Protein: 7.2 g/dL (ref 6.5–8.1)

## 2018-11-23 LAB — CBC WITH DIFFERENTIAL/PLATELET
Abs Immature Granulocytes: 0.04 10*3/uL (ref 0.00–0.07)
Basophils Absolute: 0 10*3/uL (ref 0.0–0.1)
Basophils Relative: 0 %
Eosinophils Absolute: 0 10*3/uL (ref 0.0–0.5)
Eosinophils Relative: 0 %
HCT: 37.4 % (ref 36.0–46.0)
Hemoglobin: 12.1 g/dL (ref 12.0–15.0)
Immature Granulocytes: 1 %
Lymphocytes Relative: 30 %
Lymphs Abs: 2.3 10*3/uL (ref 0.7–4.0)
MCH: 30.6 pg (ref 26.0–34.0)
MCHC: 32.4 g/dL (ref 30.0–36.0)
MCV: 94.4 fL (ref 80.0–100.0)
Monocytes Absolute: 0.7 10*3/uL (ref 0.1–1.0)
Monocytes Relative: 9 %
Neutro Abs: 4.6 10*3/uL (ref 1.7–7.7)
Neutrophils Relative %: 60 %
Platelets: 343 10*3/uL (ref 150–400)
RBC: 3.96 MIL/uL (ref 3.87–5.11)
RDW: 13.5 % (ref 11.5–15.5)
WBC: 7.7 10*3/uL (ref 4.0–10.5)
nRBC: 0 % (ref 0.0–0.2)

## 2018-11-23 LAB — URINALYSIS, ROUTINE W REFLEX MICROSCOPIC
Bilirubin Urine: NEGATIVE
Glucose, UA: NEGATIVE mg/dL
Hgb urine dipstick: NEGATIVE
Ketones, ur: 5 mg/dL — AB
Nitrite: NEGATIVE
Protein, ur: NEGATIVE mg/dL
Specific Gravity, Urine: 1.012 (ref 1.005–1.030)
pH: 6 (ref 5.0–8.0)

## 2018-11-23 LAB — LIPASE, BLOOD: Lipase: 40 U/L (ref 11–51)

## 2018-11-23 LAB — TROPONIN I (HIGH SENSITIVITY)
Troponin I (High Sensitivity): 2 ng/L (ref <18)
Troponin I (High Sensitivity): 4 ng/L (ref <18)

## 2018-11-23 MED ORDER — NITROFURANTOIN MONOHYD MACRO 100 MG PO CAPS: 100.0000 mg | ORAL_CAPSULE | Freq: Two times a day (BID) | ORAL | 0 refills | Status: AC

## 2018-11-23 MED ORDER — ACETAMINOPHEN 500 MG PO TABS
1000.0000 mg | ORAL_TABLET | Freq: Once | ORAL | Status: AC
  Administered 2018-11-23: 1000 mg via ORAL
  Filled 2018-11-23: qty 2

## 2018-11-23 NOTE — Discharge Instructions (Signed)
Your work-up was negative.  We are treating you for possible UTI.  We have given you the number for neurology.  You should call them to set up a follow-up appointment.  So that you are seen in the ER you need follow-up for having a seizure.
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
## 2018-11-24 LAB — TOPIRAMATE LEVEL: Topiramate Lvl: <1 ug/mL — ABNORMAL LOW (ref 2.0–25.0)

## 2018-11-26 ENCOUNTER — Telehealth: Payer: Self-pay | Admitting: Emergency Medicine

## 2018-11-26 LAB — URINE CULTURE: Culture: 80000 — AB

## 2018-11-26 NOTE — Telephone Encounter (Signed)
Called patient to inform of topiramate level and ask about follow up plans and who she is seeing for seizures.  No answer and no voicemail.

## 2018-11-27 NOTE — Progress Notes (Signed)
ED Antimicrobial Stewardship Positive Culture Follow Up   Jordan Bauer is an 43 y.o. female who presented to [facility] on 11/23/2018 with a chief complaint of  Chief Complaint  Patient presents with  . Seizures   Pt also with complaint of dysuria.  Recent Results (from the past 720 hour(s))  Urine culture     Status: Abnormal   Collection Time: 11/23/18  2:50 PM   Specimen: Urine, Clean Catch  Result Value Ref Range Status   Specimen Description   Final    URINE, CLEAN CATCH Performed at [facility], [address] [place], [place] [address]    Special Requests   Final    NONE Performed at [facility], [place], [place] [address]    Culture 80,000 COLONIES/mL PROTEUS MIRABILIS (A)  Final   Report Status 11/26/2018 FINAL  Final   Organism ID, Bacteria PROTEUS MIRABILIS (A)  Final      Susceptibility   Proteus mirabilis - MIC*    AMPICILLIN <=2 SENSITIVE Sensitive     CEFAZOLIN <=4 SENSITIVE Sensitive     CEFTRIAXONE <=1 SENSITIVE Sensitive     CIPROFLOXACIN <=0.25 SENSITIVE Sensitive     GENTAMICIN <=1 SENSITIVE Sensitive     IMIPENEM 2 SENSITIVE Sensitive     NITROFURANTOIN 128 RESISTANT Resistant     TRIMETH/SULFA <=20 SENSITIVE Sensitive     AMPICILLIN/SULBACTAM <=2 SENSITIVE Sensitive     PIP/TAZO <=4 SENSITIVE Sensitive     * 80,000 COLONIES/mL PROTEUS MIRABILIS    [x]  Treated with NITROFURANTOIN, organism resistant to prescribed antimicrobial  New antibiotic prescription: Bactrim DS - 1 tablet bid x 3 days  Called in to [place], [place] @ [phone]  Message left with Lilia Pro for Tonia [place], [facility]  ED Provider: Bertram Gala, PharmD, BCPS Clinical Pharmacist 11/27/2018 11:18 AM

## 2018-11-30 LAB — GI PATHOGEN PANEL BY PCR, STOOL

## 2019-06-29 ENCOUNTER — Emergency Department
Admission: EM | Admit: 2019-06-29 | Discharge: 2019-06-29 | Disposition: A | Discharge status: Home / Self Care | Payer: Medicare HMO | Attending: Student | Admitting: Student

## 2019-06-29 ENCOUNTER — Encounter: Payer: Self-pay | Admitting: Emergency Medicine

## 2019-06-29 ENCOUNTER — Other Ambulatory Visit: Payer: Self-pay

## 2019-06-29 ENCOUNTER — Emergency Department: Payer: Medicare HMO

## 2019-06-29 DIAGNOSIS — F17210 Nicotine dependence, cigarettes, uncomplicated: Secondary | ICD-10-CM | POA: Diagnosis not present

## 2019-06-29 DIAGNOSIS — R569 Unspecified convulsions: Secondary | ICD-10-CM | POA: Insufficient documentation

## 2019-06-29 DIAGNOSIS — Z91040 Latex allergy status: Secondary | ICD-10-CM | POA: Diagnosis not present

## 2019-06-29 DIAGNOSIS — J45909 Unspecified asthma, uncomplicated: Secondary | ICD-10-CM | POA: Diagnosis not present

## 2019-06-29 HISTORY — DX: Type 2 diabetes mellitus without complications: E11.9

## 2019-06-29 LAB — URINALYSIS, COMPLETE (UACMP) WITH MICROSCOPIC
Bilirubin Urine: NEGATIVE
Glucose, UA: NEGATIVE mg/dL
Hgb urine dipstick: NEGATIVE
Ketones, ur: NEGATIVE mg/dL
Nitrite: NEGATIVE
Protein, ur: NEGATIVE mg/dL
Specific Gravity, Urine: 1.005 (ref 1.005–1.030)
pH: 6 (ref 5.0–8.0)

## 2019-06-29 LAB — CBC WITH DIFFERENTIAL/PLATELET
Abs Immature Granulocytes: 0.03 10*3/uL (ref 0.00–0.07)
Basophils Absolute: 0 10*3/uL (ref 0.0–0.1)
Basophils Relative: 0 %
Eosinophils Absolute: 0 10*3/uL (ref 0.0–0.5)
Eosinophils Relative: 0 %
HCT: 24.1 % — ABNORMAL LOW (ref 36.0–46.0)
Hemoglobin: 8.2 g/dL — ABNORMAL LOW (ref 12.0–15.0)
Immature Granulocytes: 0 %
Lymphocytes Relative: 30 %
Lymphs Abs: 2.2 10*3/uL (ref 0.7–4.0)
MCH: 32.7 pg (ref 26.0–34.0)
MCHC: 34 g/dL (ref 30.0–36.0)
MCV: 96 fL (ref 80.0–100.0)
Monocytes Absolute: 0.6 10*3/uL (ref 0.1–1.0)
Monocytes Relative: 8 %
Neutro Abs: 4.5 10*3/uL (ref 1.7–7.7)
Neutrophils Relative %: 62 %
Platelets: 329 10*3/uL (ref 150–400)
RBC: 2.51 MIL/uL — ABNORMAL LOW (ref 3.87–5.11)
RDW: 14.3 % (ref 11.5–15.5)
WBC: 7.4 10*3/uL (ref 4.0–10.5)
nRBC: 0 % (ref 0.0–0.2)

## 2019-06-29 LAB — COMPREHENSIVE METABOLIC PANEL
ALT: 17 U/L (ref 0–44)
AST: 22 U/L (ref 15–41)
Albumin: 4 g/dL (ref 3.5–5.0)
Alkaline Phosphatase: 58 U/L (ref 38–126)
Anion gap: 8 (ref 5–15)
BUN: 9 mg/dL (ref 6–20)
CO2: 21 mmol/L — ABNORMAL LOW (ref 22–32)
Calcium: 8.4 mg/dL — ABNORMAL LOW (ref 8.9–10.3)
Chloride: 109 mmol/L (ref 98–111)
Creatinine, Ser: 0.89 mg/dL (ref 0.44–1.00)
GFR calc Af Amer: >60 mL/min (ref >60)
GFR calc non Af Amer: >60 mL/min (ref >60)
Glucose, Bld: 107 mg/dL — ABNORMAL HIGH (ref 70–99)
Potassium: 3.2 mmol/L — ABNORMAL LOW (ref 3.5–5.1)
Sodium: 138 mmol/L (ref 135–145)
Total Bilirubin: 0.5 mg/dL (ref 0.3–1.2)
Total Protein: 7 g/dL (ref 6.5–8.1)

## 2019-06-29 LAB — URINE DRUG SCREEN, QUALITATIVE (ARMC ONLY)
Amphetamines, Ur Screen: NOT DETECTED
Barbiturates, Ur Screen: NOT DETECTED
Benzodiazepine, Ur Scrn: NOT DETECTED
Cannabinoid 50 Ng, Ur ~~LOC~~: POSITIVE — AB
Cocaine Metabolite,Ur ~~LOC~~: POSITIVE — AB
MDMA (Ecstasy)Ur Screen: NOT DETECTED
Methadone Scn, Ur: NOT DETECTED
Opiate, Ur Screen: NOT DETECTED
Phencyclidine (PCP) Ur S: NOT DETECTED
Tricyclic, Ur Screen: NOT DETECTED

## 2019-06-29 LAB — PREGNANCY, URINE: Preg Test, Ur: NEGATIVE

## 2019-06-29 MED ORDER — ACETAMINOPHEN 500 MG PO TABS
ORAL_TABLET | ORAL | Status: AC
  Administered 2019-06-29: 1000 mg via ORAL
  Filled 2019-06-29: qty 2

## 2019-06-29 MED ORDER — ACETAMINOPHEN 500 MG PO TABS: 1000.0000 mg | ORAL_TABLET | Freq: Once | ORAL | Status: AC

## 2019-06-29 MED ORDER — KETOROLAC TROMETHAMINE 30 MG/ML IJ SOLN
INTRAMUSCULAR | Status: AC
  Administered 2019-06-29: 15 mg via INTRAVENOUS
  Filled 2019-06-29: qty 1

## 2019-06-29 MED ORDER — KETOROLAC TROMETHAMINE 30 MG/ML IJ SOLN: 15.0000 mg | Freq: Once | INTRAMUSCULAR | Status: AC

## 2019-06-29 NOTE — ED Notes (Signed)
Pt unable to sign due to not topaz pad available. Pt verbalizes understanding of d/c paperwork and does not have any questions.
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
## 2019-06-29 NOTE — Discharge Instructions (Addendum)
Thank you for letting us take care of you in the emergency department today.   Please continue to take any regular, prescribed medications.  As best you can, minimize other substance use as this can worsen your seizures.  Please follow up with: Neurology doctor - until you are cleared by the Neurologist, you should not drive or operate heavy machinery due to the risk of seizing while doing so Hematology doctor, to follow-up on your anemia  Please return to the ER for any new or worsening symptoms.
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
## 2019-06-30 LAB — URINE CULTURE: Culture: NO GROWTH

## 2019-07-01 LAB — TOPIRAMATE LEVEL: Topiramate Lvl: <1 ug/mL — ABNORMAL LOW (ref 2.0–25.0)

## 2019-07-02 ENCOUNTER — Telehealth: Payer: Self-pay | Admitting: Emergency Medicine

## 2019-07-02 NOTE — Telephone Encounter (Signed)
Called patient to give her tomiramate level which resulted after ED discharge.  There was no answer and voicemail is full.  She was instructed to follow up with neurology in [place].

## 2020-04-24 ENCOUNTER — Encounter: Payer: Self-pay | Admitting: Neurology

## 2020-05-09 ENCOUNTER — Other Ambulatory Visit (HOSPITAL_COMMUNITY)
Admission: RE | Admit: 2020-05-09 | Discharge: 2020-05-09 | Disposition: A | Discharge status: Home / Self Care | Payer: Medicare Other | Source: Ambulatory Visit | Attending: Obstetrics and Gynecology | Admitting: Obstetrics and Gynecology

## 2020-05-09 ENCOUNTER — Ambulatory Visit (INDEPENDENT_AMBULATORY_CARE_PROVIDER_SITE_OTHER): Payer: Medicare Other | Admitting: Obstetrics and Gynecology

## 2020-05-09 ENCOUNTER — Encounter: Payer: Self-pay | Admitting: Obstetrics and Gynecology

## 2020-05-09 ENCOUNTER — Other Ambulatory Visit: Payer: Self-pay

## 2020-05-09 VITALS — BP 122/64 | HR 68 | Ht 64.0 in | Wt 147.2 lb

## 2020-05-09 DIAGNOSIS — A5901 Trichomonal vulvovaginitis: Secondary | ICD-10-CM | POA: Insufficient documentation

## 2020-05-09 DIAGNOSIS — N939 Abnormal uterine and vaginal bleeding, unspecified: Secondary | ICD-10-CM

## 2020-05-09 DIAGNOSIS — N951 Menopausal and female climacteric states: Secondary | ICD-10-CM

## 2020-05-09 DIAGNOSIS — Z124 Encounter for screening for malignant neoplasm of cervix: Secondary | ICD-10-CM | POA: Diagnosis present

## 2020-05-09 DIAGNOSIS — N750 Cyst of Bartholin's gland: Secondary | ICD-10-CM | POA: Diagnosis not present

## 2020-05-09 DIAGNOSIS — Z113 Encounter for screening for infections with a predominantly sexual mode of transmission: Secondary | ICD-10-CM

## 2020-05-09 DIAGNOSIS — Z1151 Encounter for screening for human papillomavirus (HPV): Secondary | ICD-10-CM | POA: Insufficient documentation

## 2020-05-09 LAB — PREGNANCY, URINE: Preg Test, Ur: NEGATIVE

## 2020-05-09 NOTE — Patient Instructions (Addendum)
Bartholin's Cyst  A Bartholin's cyst is a fluid-filled sac that forms as a result of a blockage along the tube (duct) of the Bartholin's gland. Bartholin's glands are small glands in the folds of skin around the vaginal opening (labia). These glands produce fluid to moisten or lubricate the outside of the vagina during sex. A cyst that is not large or infected may not cause any problems or require treatment. If the cyst gets infected with bacteria, it is called a Bartholin's abscess. An abscess may cause symptoms such as pain and swelling and is more likely to require treatment. What are the causes? This condition may be caused by a blocked Bartholin's gland duct. These ducts can become blocked due to natural buildup of fluid and oils. Bacteria inside of the cyst can cause infection. In many cases, the cause is not known. What are the signs or symptoms? Symptoms may include:  A bulge or lump on the labia, near the lower opening of the vagina.  Discomfort or pain. This may get worse during sex or when walking.  Redness, swelling, or fluid draining from the area. These may be signs of an abscess. How severe your symptoms are depends on the size of your cyst and whether it is infected. Infection causes symptoms to get more severe. How is this diagnosed? This condition may be diagnosed based on:  Your symptoms and medical history.  A physical exam to check for swelling in your vaginal area. You may lie on your back on an exam table and have your feet placed into footrests for the exam.  Blood tests to check for infections.  Removal of a fluid sample from the cyst or abscess (biopsy) for testing. You may work with a health care provider who specializes in women's health (gynecologist) for diagnosis and treatment. How is this treated? If your cyst is small, not infected, and not causing symptoms, you may not need treatment. These cysts often go away on their own, with home care such as hot  baths or warm compresses. If you have a large cyst or an abscess, treatment may include:  Antibiotic medicine.  A procedure to drain the fluid inside the cyst or abscess. These procedures involve making an incision in the cyst or abscess so that the fluid drains out, and then one of the following may be done: ? A small, thin tube (catheter) may be placed inside the cyst or abscess so that it does not close and fill up with fluid again (fistulization). The catheter will be removed at a follow-up visit. ? The edges of the incision may be stitched to your skin so that the cyst or abscess stays open (marsupialization). This allows it to continue to drain and not fill up with fluid again. If you have cysts or abscesses that keep returning (recurring) and have required incision and drainage multiple times, your health care provider may talk with you about surgery to remove the Bartholin's gland. Follow these instructions at home: Medicines  Take over-the-counter and prescription medicines only as told by your health care provider.  If you were prescribed an antibiotic medicine, take it as told by your health care provider. Do not stop taking the antibiotic even if your condition improves. Managing pain and swelling  Try sitz baths to help with pain and swelling. A sitz bath is a warm water bath in which the water only comes up to your hips and should cover your buttocks. You may take sitz baths several times a   day.  Apply heat to the affected area as often as needed. Use the heat source that your health care provider recommends, such as a moist heat pack or a heating pad. ? Place a towel between your skin and the heat source. ? Leave the heat on for 20-30 minutes. ? Remove the heat if your skin turns bright red. This is especially important if you are unable to feel pain, heat, or cold. You may have a greater risk of getting burned. Do not fall asleep with the heating pad in place. General  instructions  If your cyst or abscess was drained, follow instructions from your health care provider about how to take care of your wound. Use feminine pads as needed to absorb any drainage.  Do not push on or squeeze your cyst.  Do not have sex until the cyst has gone away or your wound from drainage has healed.  Take these steps to help prevent a Bartholin's cyst from returning and to prevent other Bartholin's cysts from developing: ? Take a bath or shower once a day. Clean your vaginal area with mild soap and water when you bathe. ? Practice safe sex to prevent STIs. Talk with your health care provider about how to prevent STIs and which forms of birth control (contraception) may be best for you.  Keep all follow-up visits. This is important. Contact a health care provider if:  You have a fever.  You develop increasing redness, swelling, or pain around your cyst.  You have fluid, blood, pus, or a bad smell coming from your cyst.  You have a cyst that gets larger or comes back. Summary  A Bartholin's cyst is a fluid-filled sac that forms as a result of a blockage along the duct of the Bartholin's gland.  If your cyst is small, not infected, and not causing symptoms, you may not need any treatment.  If you have a large cyst or an abscess, your health care provider may perform a procedure to drain the fluid.  If you have cysts or abscesses that keep returning (recurring) and have required incision and drainage multiple times, your health care provider may talk with you about surgery to remove the Bartholin's gland. This information is not intended to replace advice given to you by your health care provider. Make sure you discuss any questions you have with your health care provider. Document Revised: 07/12/2019 Document Reviewed: 07/12/2019 Elsevier Patient Education  2021 [place] is the normal time of a woman's life when the levels of  estrogen, the female hormone produced by the ovaries, begin to decrease. This leads to changes in menstrual periods before they stop completely (menopause). Perimenopause can begin 2-8 years before menopause. During perimenopause, the ovaries may or may not produce an egg and a woman can still become pregnant. What are the causes? This condition is caused by a natural change in hormone levels that happens as you get older. What increases the risk? This condition is more likely to start at an earlier age if you have certain medical conditions or have undergone treatments, including:  A tumor of the pituitary gland in the brain.  A disease that affects the ovaries and hormone production.  Certain cancer treatments, such as chemotherapy or hormone therapy, or radiation therapy on the pelvis.  Heavy smoking and excessive alcohol use.  Family history of early menopause. What are the signs or symptoms? Perimenopausal changes affect each woman differently. Symptoms of this condition may include:  Hot flashes.  Irregular menstrual periods.  Night sweats.  Changes in feelings about sex. This could be a decrease in sex drive or an increased discomfort around your sexuality.  Vaginal dryness.  Headaches.  Mood swings.  Depression.  Problems sleeping (insomnia).  Memory problems or trouble concentrating.  Irritability.  Tiredness.  Weight gain.  Anxiety.  Trouble getting pregnant. How is this diagnosed? This condition is diagnosed based on your medical history, a physical exam, your age, your menstrual history, and your symptoms. Hormone tests may also be done. How is this treated? In some cases, no treatment is needed. You and your health care provider should make a decision together about whether treatment is necessary. Treatment will be based on your individual condition and preferences. Various treatments are available, such as:  Menopausal hormone therapy  (MHT).  Medicines to treat specific symptoms.  Acupuncture.  Vitamin or herbal supplements. Before starting treatment, make sure to let your health care provider know if you have a personal or family history of:  Heart disease.  Breast cancer.  Blood clots.  Diabetes.  Osteoporosis. Follow these instructions at home: Medicines  Take over-the-counter and prescription medicines only as told by your health care provider.  Take vitamin supplements only as told by your health care provider.  Talk with your health care provider before starting any herbal supplements. Lifestyle  Do not use any products that contain nicotine or tobacco, such as cigarettes, e-cigarettes, and chewing tobacco. If you need help quitting, ask your health care provider.  Get at least 30 minutes of physical activity on 5 or more days each week.  Eat a balanced diet that includes fresh fruits and vegetables, whole grains, soybeans, eggs, lean meat, and low-fat dairy.  Avoid alcoholic and caffeinated beverages, as well as spicy foods. This may help prevent hot flashes.  Get 7-8 hours of sleep each night.  Dress in layers that can be removed to help you manage hot flashes.  Find ways to manage stress, such as deep breathing, meditation, or journaling.   General instructions  Keep track of your menstrual periods, including: ? When they occur. ? How heavy they are and how long they last. ? How much time passes between periods.  Keep track of your symptoms, noting when they start, how often you have them, and how long they last.  Use vaginal lubricants or moisturizers to help with vaginal dryness and improve comfort during sex.  You can still become pregnant if you are having irregular periods. Make sure you use contraception during perimenopause if you do not want to get pregnant.  Keep all follow-up visits. This is important. This includes any group therapy or counseling.   Contact a health care  provider if:  You have heavy vaginal bleeding or pass blood clots.  Your period lasts more than 2 days longer than normal.  Your periods are recurring sooner than 21 days.  You bleed after having sex.  You have pain during sex. Get help right away if you have:  Chest pain, trouble breathing, or trouble talking.  Severe depression.  Pain when you urinate.  Severe headaches.  Vision problems. Summary  Perimenopause is the time when a woman's body begins to move into menopause. This may happen naturally or as a result of other health problems or medical treatments.  Perimenopause can begin 2-8 years before menopause, and it can last for several years.  Perimenopausal symptoms can be managed through medicines, lifestyle changes, and complementary therapies such  as acupuncture. This information is not intended to replace advice given to you by your health care provider. Make sure you discuss any questions you have with your health care provider. Document Revised: 07/29/2019 Document Reviewed: 07/29/2019 Elsevier Patient Education  [place].

## 2020-05-09 NOTE — Progress Notes (Signed)
45 y.o. [id number] Widowed Black or African American Not Hispanic or Latino female here for a vaginal cyst.  She states she had a body scan in the ED and they found a cyst.   The patient had a CT scan after falling off of a ladder. Incidental finding: Lesion along the right aspect of the vagina measures 4.0 x 2.7 cm and may represent a Bartholin's gland cyst. Ultrasound may be helpful for further evaluation if clinically indicated.  She has noticed a bump on the right vulva for months. Not tender to touch, she occasionally has a sharp pain when she coughs. It has gotten bigger. Sexually active, not having pain with intercourse.   For the last 2 months she has been spotting in between her cycles. Cycles are monthly x 5 days. Can saturate a pad in up to 2.5 hours.  The last 2 months she spotted for most of the month.  She has been having some hot flashes and night sweats.  She has sickle cell anemia.      Patient's last menstrual period was 04/26/2020.          Sexually active: Yes.    The current method of family planning is none.    Exercising: Yes.    lifting, walking  Smoker:  yes  Health Maintenance: Pap:  2021 WNL [facility]  History of abnormal Pap:  no MMG:  2021 per patient normal done in [place]  BMD:   None  Colonoscopy: none  TDaP:  2013 Gardasil: none    reports that she has been smoking. She has a 10.00 pack-year smoking history. She has never used smokeless tobacco. She reports current alcohol use. She reports current drug use. Drug: Marijuana.  Past Medical History:  Diagnosis Date  . Asthma   . Diabetes mellitus without complication ([place])   . Seizures ([place])   . Sickle cell trait ([place])   . Thyroid disease     Past Surgical History:  Procedure Laterality Date  . CYSTECTOMY Right    rigfht ovary   . TONSILLECTOMY    . TUBAL LIGATION      Current Outpatient Medications  Medication Sig Dispense Refill  . albuterol (PROVENTIL HFA;VENTOLIN HFA) 108 (90  BASE) MCG/ACT inhaler Inhale 2 puffs into the lungs every 6 (six) hours as needed for wheezing or shortness of breath.    . risperiDONE (RISPERDAL) 0.5 MG tablet Take 0.5 mg by mouth at bedtime.     . sertraline (ZOLOFT) 100 MG tablet Take 100 mg by mouth daily.    Marland Kitchen topiramate (TOPAMAX) 100 MG tablet Take 100 mg by mouth 2 (two) times daily.     No current facility-administered medications for this visit.    Family History  Problem Relation Age of Onset  . Diabetes Mother   . Sickle cell trait Mother   . Heart attack Father   . Heart attack Brother   . Sickle cell anemia Other     Review of Systems  Genitourinary: Positive for vaginal bleeding.  All other systems reviewed and are negative.   Exam:   BP 122/64   Pulse 68   Ht 5\' 4"  (1.626 m)   Wt 147 lb 3.2 oz (66.8 kg)   LMP 04/26/2020   SpO2 98%   BMI 25.27 kg/m   Weight change: @WEIGHTCHANGE @ Height:   Height: 5\' 4"  (162.6 cm)  Ht Readings from Last 3 Encounters:  05/09/20 5\' 4"  (1.626 m)  06/29/19 5' 1.5" (1.562 m)  11/23/18 5\' 2"  (1.575 m)    General appearance: alert, cooperative and appears stated age Head: Normocephalic, without obvious abnormality, atraumatic Neck: no adenopathy, supple, symmetrical, trachea midline and thyroid normal to inspection and palpation Abdomen: soft, non-tender; non distended,  no masses,  no organomegaly Extremities: extremities normal, atraumatic, no cyanosis or edema Skin: Skin color, texture, turgor normal. No rashes or lesions Lymph nodes: Cervical, supraclavicular, and axillary nodes normal. No abnormal inguinal nodes palpated Neurologic: Grossly normal   Pelvic: External genitalia:  no lesions              Urethra:  normal appearing urethra with no masses, tenderness or lesions              Bartholins and Skenes: ~3 cm right bartholin's cyst, not tender.               Vagina: normal appearing vagina with normal color and discharge, no lesions. Mild cystocele and rectocele  with valsalva.               Cervix: no lesions               Bimanual Exam:  Uterus:  no masses or tenderness              Adnexa: no mass, fullness, tenderness                 Gae Dry chaperoned for the exam.  1. Cyst of right Bartholin's gland Discussed option of following it or placement of a word catheter, she prefers drainage. Discussed risk of recurrence. Given her age will biopsy the cyst wall (will schedule this after her ultrasound)  2. Abnormal uterine bleeding Intermenstrual just in the last 2 months, some vasomotor symptoms as well - Pregnancy, urine - CBC - TSH - Cytology - PAP - US PELVIS TRANSVAGINAL NON-OB (TV ONLY); Future  3. Screening for cervical cancer - Cytology - PAP with hpv  4. Screening examination for STD (sexually transmitted disease) - RPR - HIV Antibody (routine testing w rflx) - Cytology - PAP  5. Perimenopausal vasomotor symptoms

## 2020-05-10 ENCOUNTER — Encounter: Payer: Self-pay | Admitting: Obstetrics and Gynecology

## 2020-05-10 LAB — CBC
HCT: 38.3 % (ref 35.0–45.0)
Hemoglobin: 12.6 g/dL (ref 11.7–15.5)
MCH: 32.3 pg (ref 27.0–33.0)
MCHC: 32.9 g/dL (ref 32.0–36.0)
MCV: 98.2 fL (ref 80.0–100.0)
MPV: 9.3 fL (ref 7.5–12.5)
Platelets: 317 10*3/uL (ref 140–400)
RBC: 3.9 10*6/uL (ref 3.80–5.10)
RDW: 12.5 % (ref 11.0–15.0)
WBC: 9.6 10*3/uL (ref 3.8–10.8)

## 2020-05-10 LAB — CYTOLOGY - PAP
Chlamydia: NEGATIVE
Comment: NEGATIVE
Comment: NEGATIVE
Comment: NEGATIVE
Comment: NORMAL
Diagnosis: NEGATIVE
High risk HPV: NEGATIVE
Neisseria Gonorrhea: NEGATIVE
Trichomonas: POSITIVE — AB

## 2020-05-10 LAB — HIV ANTIBODY (ROUTINE TESTING W REFLEX): HIV 1&2 Ab, 4th Generation: NONREACTIVE

## 2020-05-10 LAB — RPR: RPR Ser Ql: NONREACTIVE

## 2020-05-10 LAB — TSH: TSH: 0.72 mIU/L

## 2020-05-11 ENCOUNTER — Other Ambulatory Visit: Payer: Self-pay

## 2020-05-11 MED ORDER — METRONIDAZOLE 500 MG PO TABS
500.0000 mg | ORAL_TABLET | Freq: Two times a day (BID) | ORAL | 0 refills | Status: AC
Start: 2020-05-11 — End: ?

## 2020-06-08 ENCOUNTER — Ambulatory Visit (INDEPENDENT_AMBULATORY_CARE_PROVIDER_SITE_OTHER): Payer: Medicare HMO | Admitting: Obstetrics and Gynecology

## 2020-06-08 ENCOUNTER — Ambulatory Visit (INDEPENDENT_AMBULATORY_CARE_PROVIDER_SITE_OTHER): Payer: Medicare HMO

## 2020-06-08 ENCOUNTER — Telehealth: Payer: Self-pay | Admitting: Obstetrics and Gynecology

## 2020-06-08 ENCOUNTER — Other Ambulatory Visit: Payer: Self-pay

## 2020-06-08 ENCOUNTER — Encounter: Payer: Self-pay | Admitting: Obstetrics and Gynecology

## 2020-06-08 VITALS — BP 126/80

## 2020-06-08 DIAGNOSIS — N9489 Other specified conditions associated with female genital organs and menstrual cycle: Secondary | ICD-10-CM

## 2020-06-08 DIAGNOSIS — N840 Polyp of corpus uteri: Secondary | ICD-10-CM | POA: Diagnosis not present

## 2020-06-08 DIAGNOSIS — D259 Leiomyoma of uterus, unspecified: Secondary | ICD-10-CM

## 2020-06-08 DIAGNOSIS — N923 Ovulation bleeding: Secondary | ICD-10-CM | POA: Diagnosis not present

## 2020-06-08 DIAGNOSIS — N750 Cyst of Bartholin's gland: Secondary | ICD-10-CM

## 2020-06-08 DIAGNOSIS — N939 Abnormal uterine and vaginal bleeding, unspecified: Secondary | ICD-10-CM

## 2020-06-08 DIAGNOSIS — Z8619 Personal history of other infectious and parasitic diseases: Secondary | ICD-10-CM | POA: Diagnosis not present

## 2020-06-08 NOTE — Progress Notes (Signed)
GYNECOLOGY  VISIT   HPI: 45 y.o.   Widowed Black or [place] American Not Hispanic or Latino  female   [phone] with Patient's last menstrual period was 04/26/2020.   here for discuss ultrasound  The patient was seen last month with a bartholin's cyst and a several month h/o intermenstrual bleeding.  UPT was negative, pap was + for trich (she and partner were treated), negative GC/CT/HIV/RPR. She completed her treatment over 2 weeks ago and hasn't been sexually active. She broke up with her boyfriend.   The bartholin's cyst bothers her and she wants it treated.   GYNECOLOGIC HISTORY: LMP 04/26/2020 Contraception: tubal ligation Menopausal hormone therapy: n/a        OB History    Gravida  4   Para  4   Term  4   Preterm      AB      Living  4     SAB      IAB      Ectopic      Multiple      Live Births  4              Patient Active Problem List   Diagnosis Date Noted  . Sepsis ([place]) 05/31/2015  . Community acquired pneumonia 05/31/2015    Past Medical History:  Diagnosis Date  . Asthma   . Diabetes mellitus without complication ([place])   . Seizures ([place])   . Sickle cell trait ([place])   . Thyroid disease     Past Surgical History:  Procedure Laterality Date  . CYSTECTOMY Right    rigfht ovary   . TONSILLECTOMY    . TUBAL LIGATION      Current Outpatient Medications  Medication Sig Dispense Refill  . albuterol (PROVENTIL HFA;VENTOLIN HFA) 108 (90 BASE) MCG/ACT inhaler Inhale 2 puffs into the lungs every 6 (six) hours as needed for wheezing or shortness of breath.    . metroNIDAZOLE (FLAGYL) 500 MG tablet Take 1 tablet (500 mg total) by mouth 2 (two) times daily. 14 tablet 0  . risperiDONE (RISPERDAL) 0.5 MG tablet Take 0.5 mg by mouth at bedtime.     . sertraline (ZOLOFT) 100 MG tablet Take 100 mg by mouth daily.    Marland Kitchen topiramate (TOPAMAX) 100 MG tablet Take 100 mg by mouth 2 (two) times daily.     No current facility-administered medications for this  visit.     ALLERGIES: Amoxicillin, Aspirin, Keflex [cephalexin], and Latex  Family History  Problem Relation Age of Onset  . Diabetes Mother   . Sickle cell trait Mother   . Breast cancer Mother 63  . Heart attack Father   . Heart attack Brother   . Sickle cell anemia Other   . Breast cancer Maternal Grandmother   . Cancer Maternal Grandmother        lung    Social History   Socioeconomic History  . Marital status: Widowed    Spouse name: Not on file  . Number of children: Not on file  . Years of education: Not on file  . Highest education level: Not on file  Occupational History  . Not on file  Tobacco Use  . Smoking status: Heavy Tobacco Smoker    Packs/day: 0.50    Years: 20.00    Pack years: 10.00  . Smokeless tobacco: Never Used  Substance and Sexual Activity  . Alcohol use: Yes    Comment: Occasionally.  . Drug use: Yes  Types: Marijuana  . Sexual activity: Yes    Birth control/protection: None  Other Topics Concern  . Not on file  Social History Narrative  . Not on file   Social Determinants of Health   Financial Resource Strain: Not on file  Food Insecurity: Not on file  Transportation Needs: Not on file  Physical Activity: Not on file  Stress: Not on file  Social Connections: Not on file  Intimate Partner Violence: Not on file    ROS   Pelvic ultrasound  Indications: Intermenstrual spotting  Findings:  Anteverted Uterus 9.29 x 5.65 x 4.40  Fibroids:   1) 1.18 x 1.0 cm   2) 2.08 x 1.57 cm   3) 0.66 x 0.45 cm   4) 1.46 x 1.2 cm   5) 0.86 x 0.81 cm  Endometrium 9.34 cm  At least one echogenic focus, possibly two. The largest is 11 mm with slight vascular flow.   Left ovary 1.64 x 1.37 x 1.29 cm  Near the left ovary is a 1.2 x 1.1 cm solid mass, cannot r/o pedunculated fibroid vs bowel related. Avascular.  Right ovary 2.07 x 1.15 x 1.68 cm  No free fluid   Impression:  Normal sized uterus with multiple small myomas Thickened  endometrium with at least 1 echogenic focus c/w a polyp Normal ovaries bilaterally 1.2 cm solid, avascular mass next to the left ovary. Cannot r/o pedunculated fibroid vs bowel related No free fluid    PHYSICAL EXAMINATION:    BP 126/80 (BP Location: Right Arm, Patient Position: Sitting, Cuff Size: Normal)   LMP 04/26/2020     General appearance: alert, cooperative and appears stated age Neck: no adenopathy, supple, symmetrical, trachea midline and thyroid normal to inspection and palpation Heart: regular rate and rhythm Lungs: CTAB Abdomen: soft, non-tender; bowel sounds normal; no masses,  no organomegaly Extremities: normal, atraumatic, no cyanosis Skin: normal color, texture and turgor, no rashes or lesions Lymph: normal cervical supraclavicular and inguinal nodes Neurologic: grossly normal   Pelvic: External genitalia:  no lesions              Urethra:  normal appearing urethra with no masses, tenderness or lesions              Bartholins and Skenes: 2 cm right bartholin's cyst                 Vagina: normal appearing vagina with a slight increase in clumpy, white vaginal discharge (denies symptoms).              Cervix: no lesions                Chaperone was present for exam.  1. Intermenstrual spotting Endometrial polyp on ultrasound.  2. Endometrial polyp Plan: hysteroscopy, polypectomy, dilation and curettage. Reviewed risks, including: bleeding, infection, uterine perforation, fluid overload, need for further sugery   3. Cyst of right Bartholin's gland Plan bartholin's cyst wall biopsy with marsupialization   4. History of trichomoniasis Treated. Other std testing negative - SURESWAB CT/NG/T. vaginalis  5. Uterine leiomyoma, unspecified location Small, not intracavitary, incidental finding  6. Adnexal mass 1.2 cm solid avascular mass next to the left ovary. Possible fibroid, possible bowel.  Will repeat ultrasound in 3 months.   In addition to reviewing  the ultrasound, over 20 minutes was spent in total patient care. This included reevaluation for trichomoniasis, counseling and management.

## 2020-06-08 NOTE — Patient Instructions (Signed)
Trichomoniasis Trichomoniasis is an STI (sexually transmitted infection) that can affect both women and men. In women, the outer area of the female genitalia (vulva) and the vagina are affected. In men, mainly the penis is affected, but the prostate and other reproductive organs can also be involved.  This condition can be treated with medicine. It often has no symptoms (is asymptomatic), especially in men. If not treated, trichomoniasis can last for months or years. What are the causes? This condition is caused by a parasite called Trichomonas vaginalis. Trichomoniasis most often spreads from person to person (is contagious) through sexual contact. What increases the risk? The following factors may make you more likely to develop this condition:  Having unprotected sex.  Having sex with a partner who has trichomoniasis.  Having multiple sexual partners.  Having had previous trichomoniasis infections or other STIs. What are the signs or symptoms? In women, symptoms of trichomoniasis include:  Abnormal vaginal discharge that is clear, white, gray, or yellow-green and foamy and has an unusual "fishy" odor.  Itching and irritation of the vagina and vulva.  Burning or pain during urination or sex.  Redness and swelling of the genitals. In men, symptoms of trichomoniasis include:  Penile discharge that may be foamy or contain pus.  Pain in the penis. This may happen only when urinating.  Itching or irritation inside the penis.  Burning after urination or ejaculation. How is this diagnosed? In women, this condition may be found during a routine Pap test or physical exam. It may be found in men during a routine physical exam. Your health care provider may do tests to help diagnose this infection, such as:  Urine tests (men and women).  The following in women: ? Testing the pH of the vagina. ? A vaginal swab test that checks for the Trichomonas vaginalis parasite. ? Testing vaginal  secretions. Your health care provider may test you for other STIs, including HIV (human immunodeficiency virus). How is this treated? This condition is treated with medicine taken by mouth (orally), such as metronidazole or tinidazole, to fight the infection. Your sexual partner(s) also need to be tested and treated.  If you are a woman and you plan to become pregnant or think you may be pregnant, tell your health care provider right away. Some medicines that are used to treat the infection should not be taken during pregnancy. Your health care provider may recommend over-the-counter medicines or creams to help relieve itching or irritation. You may be tested for infection again 3 months after treatment.   Follow these instructions at home:  Take and use over-the-counter and prescription medicines, including creams, only as told by your health care provider.  Take your antibiotic medicine as told by your health care provider. Do not stop taking the antibiotic even if you start to feel better.  Do not have sex until 7-10 days after you finish your medicine, or until your health care provider approves. Ask your health care provider when you may start to have sex again.  (Women) Do not douche or wear tampons while you have the infection.  Discuss your infection with your sexual partner(s). Make sure that your partner gets tested and treated, if necessary.  Keep all follow-up visits as told by your health care provider. This is important. How is this prevented?  Use condoms every time you have sex. Using condoms correctly and consistently can help protect against STIs.  Avoid having multiple sexual partners.  Talk with your sexual partner about  any symptoms that either of you may have, as well as any history of STIs.  Get tested for STIs and STDs (sexually transmitted diseases) before you have sex. Ask your partner to do the same.  Do not have sexual contact if you have symptoms of  trichomoniasis or another STI.   Contact a health care provider if:  You still have symptoms after you finish your medicine.  You develop pain in your abdomen.  You have pain when you urinate.  You have bleeding after sex.  You develop a rash.  You feel nauseous or you vomit.  You plan to become pregnant or think you may be pregnant. Summary  Trichomoniasis is an STI (sexually transmitted infection) that can affect both women and men.  This condition often has no symptoms (is asymptomatic), especially in men.  Without treatment, this condition can last for months or years.  You should not have sex until 7-10 days after you finish your medicine, or until your health care provider approves. Ask your health care provider when you may start to have sex again.  Discuss your infection with your sexual partner(s). Make sure that your partner gets tested and treated, if necessary. This information is not intended to replace advice given to you by your health care provider. Make sure you discuss any questions you have with your health care provider. Document Revised: 11/25/2017 Document Reviewed: 11/25/2017 Elsevier Patient Education  2021 [employer].

## 2020-06-08 NOTE — Telephone Encounter (Signed)
Please let the patient know that I'm going to order an ultrasound on her in 3 months to f/u on the structure noted today next to her left ovary (suspect fibroid or bowel). I want to document stability. Also, since she is going to have her bartholin's drained in the OR at the time of hysteroscopy, instead of placing a word catheter after draining the bartholin's, I'm going to marsupialize the cyst wall (sew it open).  I think it will be more likely to prevent it from forming again. It takes a little longer, but since she will be under anesthesia I think it makes sense.

## 2020-06-09 LAB — SURESWAB CT/NG/T. VAGINALIS
C. trachomatis RNA, TMA: NOT DETECTED
N. gonorrhoeae RNA, TMA: NOT DETECTED
Trichomonas vaginalis RNA: NOT DETECTED

## 2020-06-13 ENCOUNTER — Telehealth: Payer: Self-pay | Admitting: *Deleted

## 2020-06-13 NOTE — Telephone Encounter (Signed)
Call placed to patient. No answer, mailbox full, unable to leave message.  No alternative number, MyChart not active.

## 2020-06-13 NOTE — Telephone Encounter (Signed)
See telephone encounter dated 06/13/20.

## 2020-06-13 NOTE — Telephone Encounter (Signed)
Salvadore Dom, MD 5 days ago       Please let the patient know that I'm going to order an ultrasound on her in 3 months to f/u on the structure noted today next to her left ovary (suspect fibroid or bowel). I want to document stability. Also, since she is going to have her bartholin's drained in the OR at the time of hysteroscopy, instead of placing a word catheter after draining the bartholin's, I'm going to marsupialize the cyst wall (sew it open).  I think it will be more likely to prevent it from forming again. It takes a little longer, but since she will be under anesthesia I think it makes sense.

## 2020-06-20 ENCOUNTER — Ambulatory Visit: Payer: Medicare Other | Admitting: Obstetrics and Gynecology

## 2020-06-22 ENCOUNTER — Ambulatory Visit: Payer: Medicaid Other | Admitting: Obstetrics and Gynecology

## 2020-06-22 DIAGNOSIS — Z0289 Encounter for other administrative examinations: Secondary | ICD-10-CM

## 2020-06-23 NOTE — Telephone Encounter (Signed)
-----   Message from Salvadore Dom, MD sent at 06/08/2020  5:31 PM EDT ----- Surgery: Hysteroscopy, polypectomy, D&C, bartholin cyst marsupialization with biopsy of the cyst wall   Diagnosis: intermenstrual spotting, thickened endometrium, right bartholin's cyst  Location: [facility]  Status: Outpatient  Time: 8 Minutes  Assistant: None  Urgency: At Patient's Convenience  Pre-Op Appointment: Completed  Post-Op Appointment(s): 1 Week, 4 Weeks  Time Out Of Work: Day Of Surgery, 1 Day Post Op  CC: [employer]

## 2020-07-18 NOTE — Telephone Encounter (Signed)
Spoke with patient, advised as seen below per Dr. Talbert Nan, patient request to proceed with surgery scheduling, request to schedule out 2.5 wks at minimum. Reviewed dates, patient request to proceed with surgery on 08/15/20.   PUS scheduled for 09/21/20.  Pre-op on 07/31/20  Advised patient I will return call once surgery date is confirmed. Patient agreeable.   Surgery request sent.   Routing to [employer]

## 2020-07-26 NOTE — Telephone Encounter (Signed)
Call to patient left message to return my call   Left voicemail requesting a return call to review benefits for  [place] CYST MARSUPIALIZATION with biopsy of the cyst wall

## 2020-07-28 NOTE — Telephone Encounter (Signed)
Call placed to patient. Need to reschedule 07/31/20 pre-op, provider will be out.   No answer, mailbox full. Option provided to send SMS notification, notification sent.  Pre-op rescheduled to 6/8 at 1:30pm with Dr. Talbert Nan.

## 2020-07-31 ENCOUNTER — Encounter: Payer: Self-pay | Admitting: Obstetrics and Gynecology

## 2020-08-01 NOTE — Telephone Encounter (Signed)
Call to patient, no answer, mailbox full, unable to leave message.  SMS notification sent.

## 2020-08-02 ENCOUNTER — Encounter: Payer: Self-pay | Admitting: Obstetrics and Gynecology

## 2020-08-02 DIAGNOSIS — Z0289 Encounter for other administrative examinations: Secondary | ICD-10-CM

## 2020-08-02 NOTE — Telephone Encounter (Signed)
No return call from patient.   Patient did not show for pre-op appt scheduled for 08/02/20.   Call placed to central scheduling, spoke with Colletta Maryland, surgery cancelled for 08/15/20 at [facility] to Dr. Rosann Auerbach.   Cc: KimAlexis

## 2020-08-02 NOTE — Progress Notes (Deleted)
GYNECOLOGY  VISIT   HPI: 45 y.o.   Widowed Black or [place] American Not Hispanic or Latino  female   [phone] with No LMP recorded.   here for a preoperative visit. She has been having issues with intermenstrual spotting and a right bartholin's cyst. An endometrial polyp was noted on ultrasound.      Pelvic ultrasound from 06/08/20: Indications: Intermenstrual spotting Findings: Anteverted Uterus 9.29 x 5.65 x 4.40             Fibroids:                         1) 1.18 x 1.0 cm                         2) 2.08 x 1.57 cm                         3) 0.66 x 0.45 cm                         4) 1.46 x 1.2 cm                         5) 0.86 x 0.81 cm Endometrium 9.34 cm             At least one echogenic focus, possibly two. The largest is 11 mm with slight vascular flow.  Left ovary 1.64 x 1.37 x 1.29 cm             Near the left ovary is a 1.2 x 1.1 cm solid mass, cannot r/o pedunculated fibroid vs bowel related. Avascular. 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 Cannot r/o pedunculated fibroid vs bowel related No free fluid  GYNECOLOGIC HISTORY: No LMP recorded. Contraception:*** Menopausal hormone therapy: ***        OB History    Gravida  4   Para  4   Term  4   Preterm      AB      Living  4     SAB      IAB      Ectopic      Multiple      Live Births  4              Patient Active Problem List   Diagnosis Date Noted  . Sepsis ([place]) 05/31/2015  . Community acquired pneumonia 05/31/2015    Past Medical History:  Diagnosis Date  . Asthma   . Diabetes mellitus without complication ([place])   . Seizures ([place])   . Sickle cell trait ([place])   . Thyroid disease     Past Surgical History:  Procedure Laterality Date  . CYSTECTOMY Right     rigfht ovary   . TONSILLECTOMY    . TUBAL LIGATION      Current Outpatient Medications  Medication Sig Dispense Refill  . albuterol (PROVENTIL HFA;VENTOLIN HFA) 108 (90 BASE) MCG/ACT inhaler Inhale 2 puffs into the lungs every 6 (six) hours as needed for wheezing or shortness of breath.    . metroNIDAZOLE (FLAGYL) 500 MG tablet Take 1 tablet (500  mg total) by mouth 2 (two) times daily. 14 tablet 0  . risperiDONE (RISPERDAL) 0.5 MG tablet Take 0.5 mg by mouth at bedtime.     . sertraline (ZOLOFT) 100 MG tablet Take 100 mg by mouth daily.    Marland Kitchen topiramate (TOPAMAX) 100 MG tablet Take 100 mg by mouth 2 (two) times daily.     No current facility-administered medications for this visit.     ALLERGIES: Amoxicillin, Aspirin, Keflex [cephalexin], and Latex  Family History  Problem Relation Age of Onset  . Diabetes Mother   . Sickle cell trait Mother   . Breast cancer Mother 66  . Heart attack Father   . Heart attack Brother   . Sickle cell anemia Other   . Breast cancer Maternal Grandmother   . Cancer Maternal Grandmother        lung    Social History   Socioeconomic History  . Marital status: Widowed    Spouse name: Not on file  . Number of children: Not on file  . Years of education: Not on file  . Highest education level: Not on file  Occupational History  . Not on file  Tobacco Use  . Smoking status: Heavy Tobacco Smoker    Packs/day: 0.50    Years: 20.00    Pack years: 10.00  . Smokeless tobacco: Never Used  Substance and Sexual Activity  . Alcohol use: Yes    Comment: Occasionally.  . Drug use: Yes    Types: Marijuana  . Sexual activity: Yes    Birth control/protection: None  Other Topics Concern  . Not on file  Social History Narrative  . Not on file   Social Determinants of Health   Financial Resource Strain: Not on file  Food Insecurity: Not on file  Transportation Needs: Not on file  Physical Activity: Not on file  Stress: Not on file  Social  Connections: Not on file  Intimate Partner Violence: Not on file    ROS  PHYSICAL EXAMINATION:    There were no vitals taken for this visit.    General appearance: alert, cooperative and appears stated age Neck: no adenopathy, supple, symmetrical, trachea midline and thyroid {CHL AMB PHY EX THYROID NORM DEFAULT:[phone]::"normal to inspection and palpation"} Heart: regular rate and rhythm Lungs: CTAB Abdomen: soft, non-tender; bowel sounds normal; no masses,  no organomegaly Extremities: normal, atraumatic, no cyanosis Skin: normal color, texture and turgor, no rashes or lesions Lymph: normal cervical supraclavicular and inguinal nodes Neurologic: grossly normal   Pelvic: External genitalia:  no lesions              Urethra:  normal appearing urethra with no masses, tenderness or lesions              Bartholins and Skenes: normal                 Vagina: normal appearing vagina with normal color and discharge, no lesions              Cervix: {CHL AMB PHY EX CERVIX NORM [phone]::"no lesions"}              Bimanual Exam:  Uterus:  {CHL AMB PHY EX UTERUS NORM [phone]::"normal size, contour, position, consistency, mobility, non-tender"}              Adnexa: {CHL AMB PHY EX ADNEXA NO MASS DEFAULT:[phone]::"no mass, fullness, tenderness"}              Rectovaginal: {yes no:[id number]}.  Confirms.              Anus:  normal sphincter tone, no lesions  Chaperone was present for exam.   1. Intermenstrual spotting Endometrial polyp on ultrasound  2. Endometrial polyp Plan: hysteroscopy, polypectomy, dilation and curettage. Reviewed risks, including: bleeding, infection, uterine perforation, fluid overload, need for further sugery  3. Cyst of right Bartholin's gland Plan biopsy of cyst wall and marsupialization of bartholin's cyst

## 2020-08-03 ENCOUNTER — Ambulatory Visit: Payer: Medicare HMO | Admitting: Neurology

## 2020-08-07 ENCOUNTER — Telehealth: Payer: Self-pay | Admitting: *Deleted

## 2020-08-07 NOTE — Telephone Encounter (Deleted)
-----   Message from Sinclair Grooms sent at 08/03/2020  9:34 AM EDT ----- Regarding: pre op/surgery Patient called to rescheduled her missed pre op appointment.

## 2020-08-07 NOTE — Telephone Encounter (Signed)
See open telephone encounter dated 06/13/20.   Encounter closed.

## 2020-08-07 NOTE — Telephone Encounter (Signed)
-----   Message from Sinclair Grooms sent at 08/03/2020  9:34 AM EDT ----- Regarding: pre op/surgery Patient called to rescheduled her missed pre op appointment.

## 2020-08-08 NOTE — Telephone Encounter (Signed)
Call placed to patient, call was answered and immediately disconnected by the person that answered.    Call placed again, no answer, voicemail not available.

## 2020-08-15 ENCOUNTER — Ambulatory Visit (HOSPITAL_BASED_OUTPATIENT_CLINIC_OR_DEPARTMENT_OTHER): Admit: 2020-08-15 | Payer: Medicare HMO | Admitting: Obstetrics and Gynecology

## 2020-08-15 ENCOUNTER — Encounter (HOSPITAL_BASED_OUTPATIENT_CLINIC_OR_DEPARTMENT_OTHER): Payer: Self-pay

## 2020-08-15 SURGERY — DILATATION & CURETTAGE/HYSTEROSCOPY WITH MYOSURE
Anesthesia: Choice | Laterality: Right

## 2020-08-24 ENCOUNTER — Encounter: Payer: Self-pay | Admitting: Obstetrics and Gynecology

## 2020-08-24 NOTE — Progress Notes (Deleted)
GYNECOLOGY  VISIT   HPI: 45 y.o.   Widowed Black or [place] American Not Hispanic or Latino  female   [phone] with No LMP recorded.   here for     GYNECOLOGIC HISTORY: No LMP recorded. Contraception:*** Menopausal hormone therapy: ***        OB History     Gravida  4   Para  4   Term  4   Preterm      AB      Living  4      SAB      IAB      Ectopic      Multiple      Live Births  4              Patient Active Problem List   Diagnosis Date Noted   Sepsis ([place]) 05/31/2015   Community acquired pneumonia 05/31/2015    Past Medical History:  Diagnosis Date   Asthma    Diabetes mellitus without complication ([place])    Seizures ([place])    Sickle cell trait ([place])    Thyroid disease     Past Surgical History:  Procedure Laterality Date   CYSTECTOMY Right    rigfht ovary    TONSILLECTOMY     TUBAL LIGATION      Current Outpatient Medications  Medication Sig Dispense Refill   albuterol (PROVENTIL HFA;VENTOLIN HFA) 108 (90 BASE) MCG/ACT inhaler Inhale 2 puffs into the lungs every 6 (six) hours as needed for wheezing or shortness of breath.     metroNIDAZOLE (FLAGYL) 500 MG tablet Take 1 tablet (500 mg total) by mouth 2 (two) times daily. 14 tablet 0   risperiDONE (RISPERDAL) 0.5 MG tablet Take 0.5 mg by mouth at bedtime.      sertraline (ZOLOFT) 100 MG tablet Take 100 mg by mouth daily.     topiramate (TOPAMAX) 100 MG tablet Take 100 mg by mouth 2 (two) times daily.     No current facility-administered medications for this visit.     ALLERGIES: Amoxicillin, Aspirin, Keflex [cephalexin], and Latex  Family History  Problem Relation Age of Onset   Diabetes Mother    Sickle cell trait Mother    Breast cancer Mother 76   Heart attack Father    Heart attack Brother    Sickle cell anemia Other    Breast cancer Maternal Grandmother    Cancer Maternal Grandmother        lung    Social History   Socioeconomic History   Marital status: Widowed     Spouse name: Not on file   Number of children: Not on file   Years of education: Not on file   Highest education level: Not on file  Occupational History   Not on file  Tobacco Use   Smoking status: Heavy Smoker    Packs/day: 0.50    Years: 20.00    Pack years: 10.00    Types: Cigarettes   Smokeless tobacco: Never  Substance and Sexual Activity   Alcohol use: Yes    Comment: Occasionally.   Drug use: Yes    Types: Marijuana   Sexual activity: Yes    Birth control/protection: None  Other Topics Concern   Not on file  Social History Narrative   Not on file   Social Determinants of Health   Financial Resource Strain: Not on file  Food Insecurity: Not on file  Transportation Needs: Not on file  Physical Activity: Not on file  Stress: Not on file  Social Connections: Not on file  Intimate Partner Violence: Not on file    ROS  PHYSICAL EXAMINATION:    There were no vitals taken for this visit.    General appearance: alert, cooperative and appears stated age Neck: no adenopathy, supple, symmetrical, trachea midline and thyroid {CHL AMB PHY EX THYROID NORM DEFAULT:[id number]} Breasts: {Exam; breast:13139} Abdomen: soft, non-tender; non distended, no masses,  no organomegaly  Pelvic: External genitalia:  no lesions              Urethra:  normal appearing urethra with no masses, tenderness or lesions              Bartholins and Skenes: normal                 Vagina: normal appearing vagina with normal color and discharge, no lesions              Cervix: {CHL AMB PHY EX CERVIX NORM [phone]}              Bimanual Exam:  Uterus:  {CHL AMB PHY EX UTERUS NORM DEFAULT:[phone]}              Adnexa: {CHL AMB PHY EX ADNEXA NO MASS DEFAULT:[phone]}              Rectovaginal: {yes no:314532}.  Confirms.              Anus:  normal sphincter tone, no lesions  Chaperone was present for exam.  ASSESSMENT     PLAN    An After Visit Summary was printed and given  to the patient.  *** minutes face to face time of which over 50% was spent in counseling.

## 2020-09-15 NOTE — Telephone Encounter (Signed)
No return call from patient to reschedule surgery.   Dr. Talbert Nan -please advise.

## 2020-09-18 NOTE — Telephone Encounter (Signed)
Letter mailed to pt.  

## 2020-09-19 NOTE — Telephone Encounter (Signed)
Francesca Jewett, MD  You 4 days ago     Please send the patient a letter that we have been unable to get in touch with her. I would advise her to either return here for treatment or establish care with another GYN. She has a suspected endometrial polyp, an adnexal mass and a bartholin's cyst. All of this should be further evaluated.  Avoiding evaluation and treatment could lead to delayed diagnosis of possible cancer.     Letter documented in Epic by [employer] and mailed.   Encounter closed.

## 2020-09-21 ENCOUNTER — Other Ambulatory Visit: Payer: Self-pay | Admitting: Obstetrics and Gynecology

## 2020-09-21 ENCOUNTER — Other Ambulatory Visit: Payer: Self-pay

## 2020-09-21 DIAGNOSIS — Z0289 Encounter for other administrative examinations: Secondary | ICD-10-CM

## 2020-09-21 NOTE — Progress Notes (Deleted)
GYNECOLOGY  VISIT   HPI: 45 y.o.   Widowed Black or [place] American Not Hispanic or Latino  female   [phone] with No LMP recorded.   here for  a f/u ultrasound for an adnexal mass. She had an ultrasound in 4/22 for intermenstrual bleeding with the following findings: Normal sized uterus with multiple small myomas Thickened endometrium with at least 1 echogenic focus c/w a polyp Normal ovaries bilaterally 1.2 cm solid, avascular mass next to the left ovary. Cannot r/o pedunculated fibroid vs bowel related No free fluid She is here for repeat imaging of the left adnexal mass. She was also scheduled for hysteroscopy, D&C as well as marsupialization of a persistent left bartholin's cyst. Her surgery was canceled because she didn't show for her preoperative visit       GYNECOLOGIC HISTORY: No LMP recorded. Contraception:*** Menopausal hormone therapy: ***        OB History     Gravida  4   Para  4   Term  4   Preterm      AB      Living  4      SAB      IAB      Ectopic      Multiple      Live Births  4              Patient Active Problem List   Diagnosis Date Noted   Sepsis ([place]) 05/31/2015   Community acquired pneumonia 05/31/2015    Past Medical History:  Diagnosis Date   Asthma    Diabetes mellitus without complication ([place])    Seizures ([place])    Sickle cell trait ([place])    Thyroid disease     Past Surgical History:  Procedure Laterality Date   CYSTECTOMY Right    rigfht ovary    TONSILLECTOMY     TUBAL LIGATION      Current Outpatient Medications  Medication Sig Dispense Refill   albuterol (PROVENTIL HFA;VENTOLIN HFA) 108 (90 BASE) MCG/ACT inhaler Inhale 2 puffs into the lungs every 6 (six) hours as needed for wheezing or shortness of breath.     metroNIDAZOLE (FLAGYL) 500 MG tablet Take 1 tablet (500 mg total) by mouth 2 (two) times daily. 14 tablet 0   risperiDONE (RISPERDAL) 0.5 MG tablet Take 0.5 mg by mouth at bedtime.       sertraline (ZOLOFT) 100 MG tablet Take 100 mg by mouth daily.     topiramate (TOPAMAX) 100 MG tablet Take 100 mg by mouth 2 (two) times daily.     No current facility-administered medications for this visit.     ALLERGIES: Amoxicillin, Aspirin, Keflex [cephalexin], and Latex  Family History  Problem Relation Age of Onset   Diabetes Mother    Sickle cell trait Mother    Breast cancer Mother 75   Heart attack Father    Heart attack Brother    Sickle cell anemia Other    Breast cancer Maternal Grandmother    Cancer Maternal Grandmother        lung    Social History   Socioeconomic History   Marital status: Widowed    Spouse name: Not on file   Number of children: Not on file   Years of education: Not on file   Highest education level: Not on file  Occupational History   Not on file  Tobacco Use   Smoking status: Heavy Smoker    Packs/day: 0.50  Years: 20.00    Pack years: 10.00    Types: Cigarettes   Smokeless tobacco: Never  Substance and Sexual Activity   Alcohol use: Yes    Comment: Occasionally.   Drug use: Yes    Types: Marijuana   Sexual activity: Yes    Birth control/protection: None  Other Topics Concern   Not on file  Social History Narrative   Not on file   Social Determinants of Health   Financial Resource Strain: Not on file  Food Insecurity: Not on file  Transportation Needs: Not on file  Physical Activity: Not on file  Stress: Not on file  Social Connections: Not on file  Intimate Partner Violence: Not on file    ROS  PHYSICAL EXAMINATION:    There were no vitals taken for this visit.    General appearance: alert, cooperative and appears stated age Neck: no adenopathy, supple, symmetrical, trachea midline and thyroid {CHL AMB PHY EX THYROID NORM DEFAULT:[phone]} Breasts: {Exam; breast:13139} Abdomen: soft, non-tender; non distended, no masses,  no organomegaly  Pelvic: External genitalia:  no lesions              Urethra:  normal  appearing urethra with no masses, tenderness or lesions              Bartholins and Skenes: normal                 Vagina: normal appearing vagina with normal color and discharge, no lesions              Cervix: {CHL AMB PHY EX CERVIX NORM DEFAULT:[phone]}              Bimanual Exam:  Uterus:  {CHL AMB PHY EX UTERUS NORM DEFAULT:[phone]}              Adnexa: {CHL AMB PHY EX ADNEXA NO MASS DEFAULT:[phone]}              Rectovaginal: {yes no:314532}.  Confirms.              Anus:  normal sphincter tone, no lesions  Chaperone was present for exam.  ASSESSMENT     PLAN    An After Visit Summary was printed and given to the patient.  *** minutes face to face time of which over 50% was spent in counseling.

## 2021-03-08 ENCOUNTER — Telehealth: Payer: Self-pay

## 2021-03-08 NOTE — Telephone Encounter (Signed)
Call to patient. Unable to leave voicemail requesting a return call to discuss scheduling recommended surgery with Dr. Talbert Nan.  Routing to Dr. Talbert Nan and Glorianne Manchester, RN. Patient has been unable to be reached previously. See encounter dated 06/13/2020. OK to close?

## 2021-03-12 NOTE — Telephone Encounter (Signed)
Please send the patient a letter in regards to the surgery she didn't schedule and the follow up ultrasound she should have had. Encourage her to get care, here or elsewhere.  CC: Hayley Carder

## 2021-03-19 NOTE — Telephone Encounter (Signed)
Letter pended. Copy to Dr. Talbert Nan for review.

## 2021-03-29 NOTE — Telephone Encounter (Signed)
Letter reviewed and signed by Dr. Jertson.  Letter mailed to address on file.   Encounter closed. 

## 2021-12-30 IMAGING — CT CT MAXILLOFACIAL W/O CM
3 series · 16 of 47 positions shown, 19 images · non-contrast
Comparison: None.

CLINICAL DATA: Seizure, fell face first

EXAM:
CT MAXILLOFACIAL WITHOUT CONTRAST
TECHNIQUE: Multidetector CT imaging of the maxillofacial structures was
performed. Multiplanar CT image reconstructions were also generated.

[Series 2: max soft · axial · 0.33mm/px · z∈[-272,-142]mm · 10 of 77 slices shown, 13 images]
[im 6/77  brain]
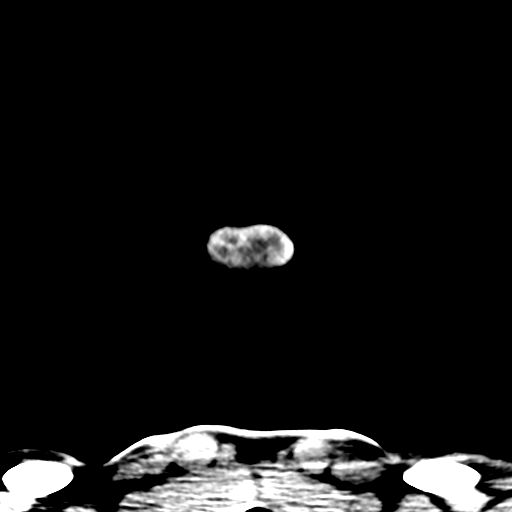
[im 6/77  bone]
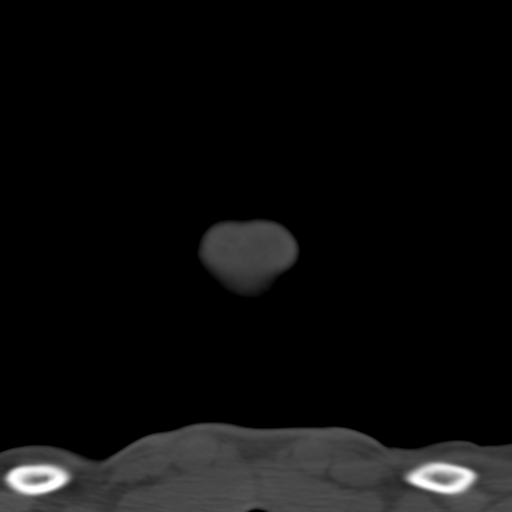
[im 14/77  bone]
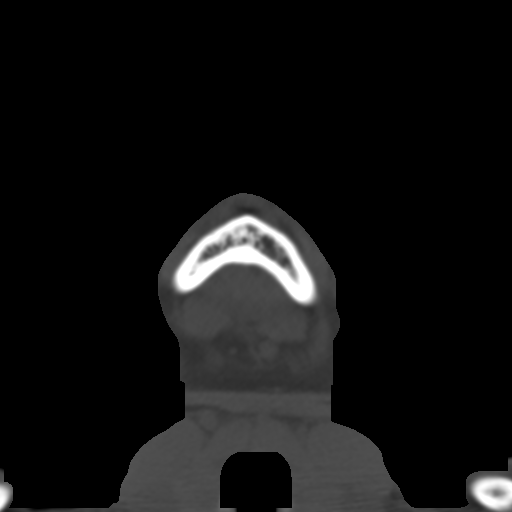
[im 21/77  bone]
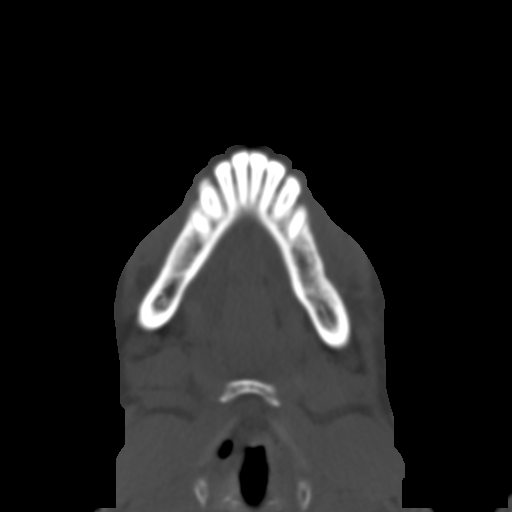
[im 27/77  bone]
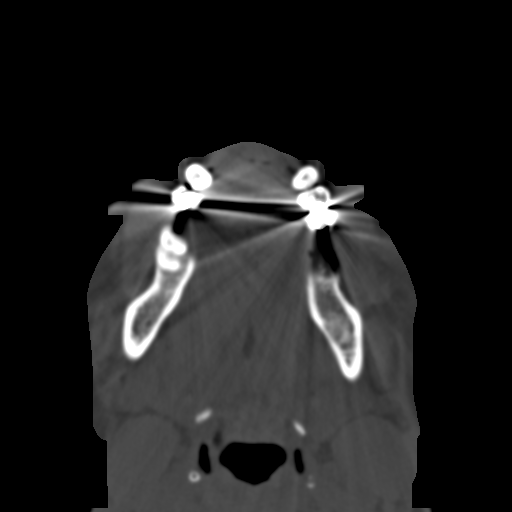
[im 35/77  brain]
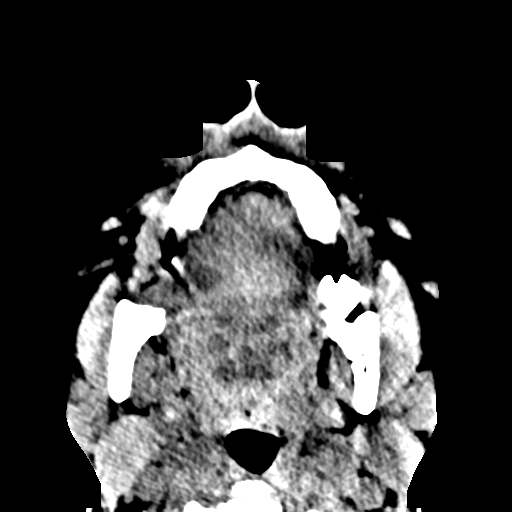
[im 35/77  bone]
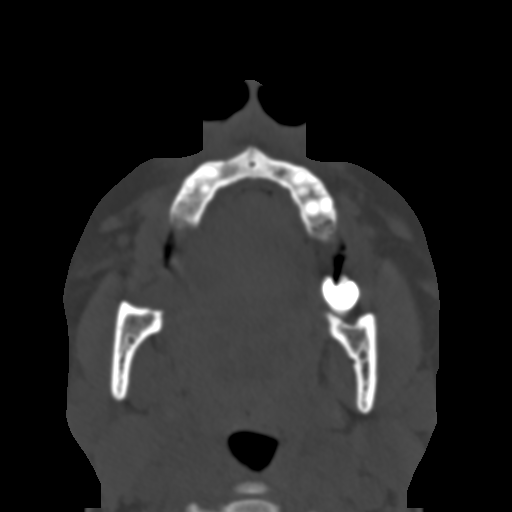
[im 42/77  bone]
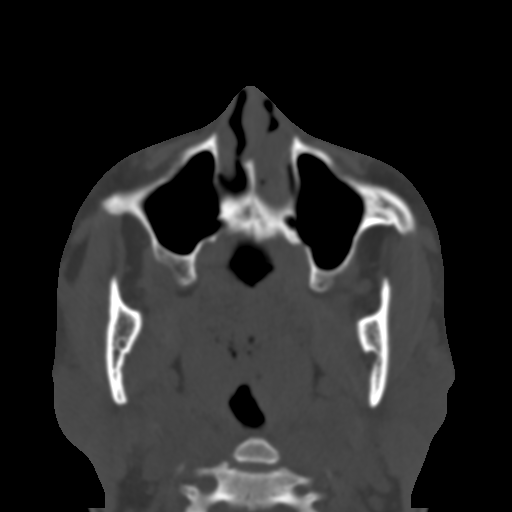
[im 50/77  bone]
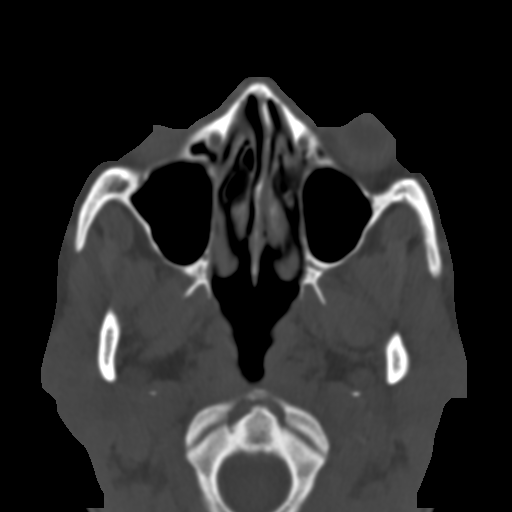
[im 58/77  bone]
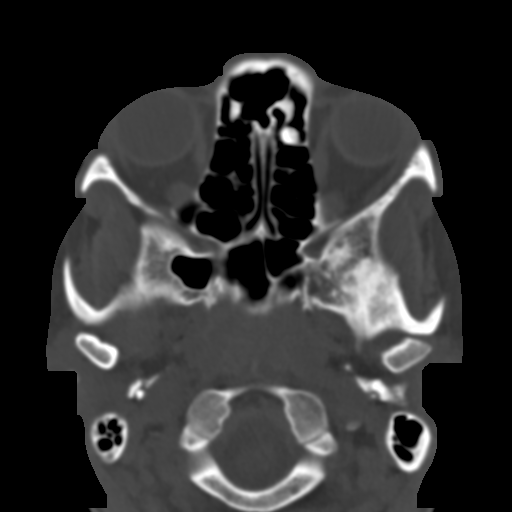
[im 63/77  brain]
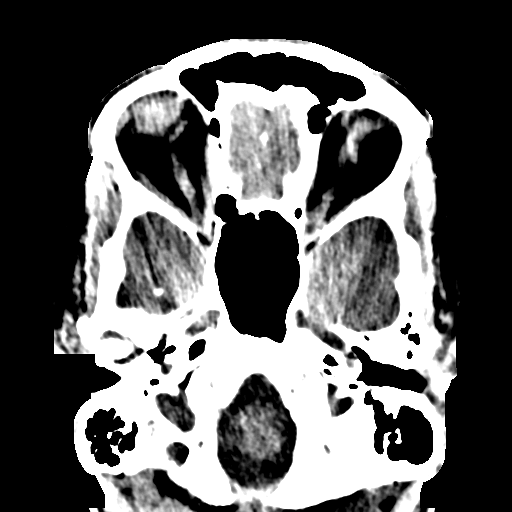
[im 63/77  bone]
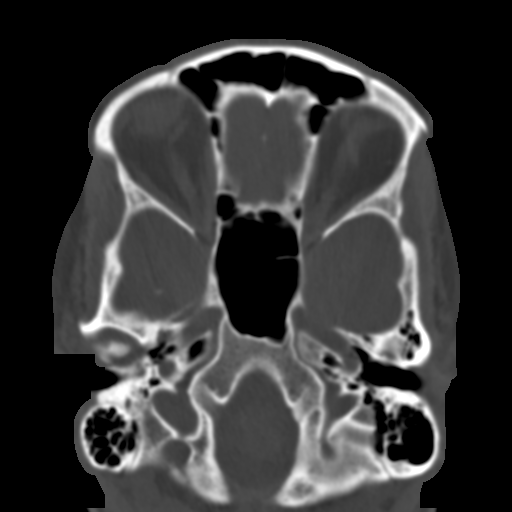
[im 71/77  bone]
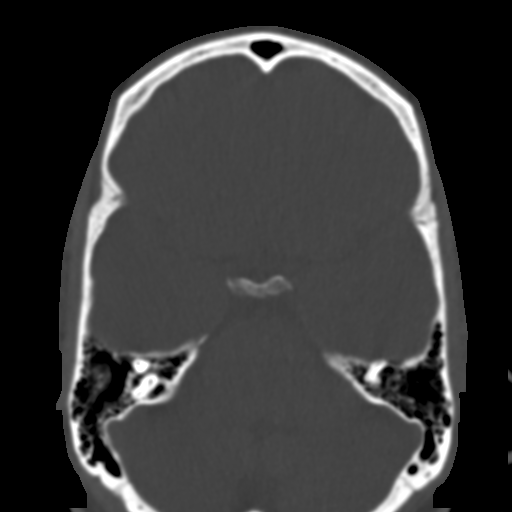

[Series 6: coronal soft · coronal · 0.32mm/px · 3 of 80 slices shown]
[im 27/80  bone]
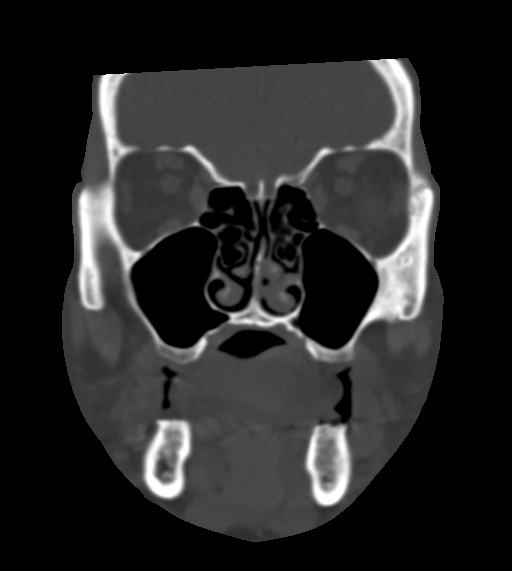
[im 36/80  bone]
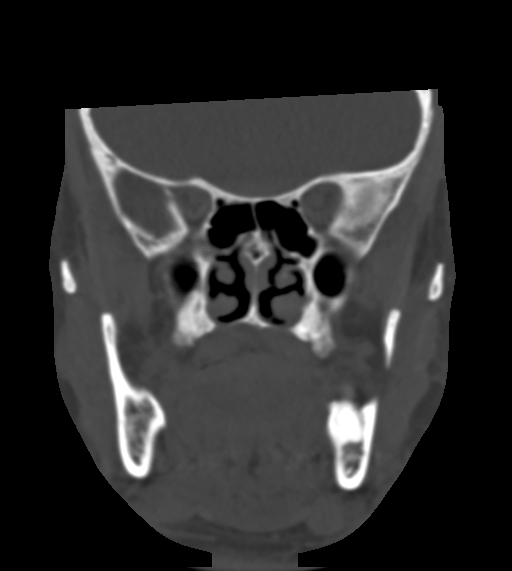
[im 44/80  bone]
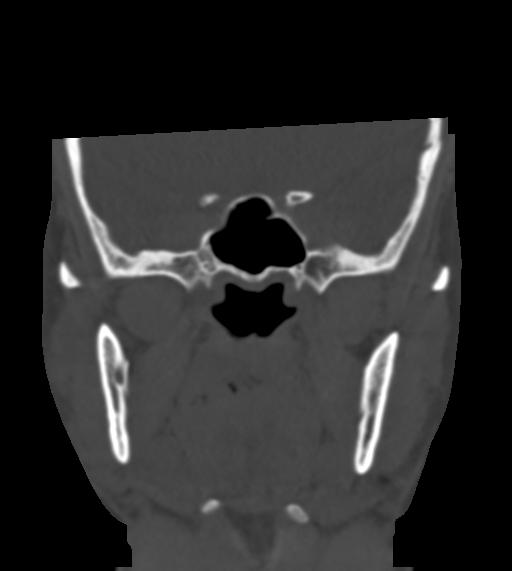

[Series 7: sagittal soft · sagittal · 0.34mm/px · 3 of 79 slices shown]
[im 27/79  bone]
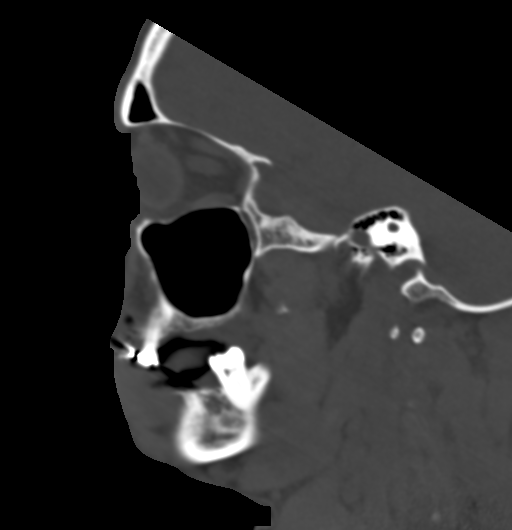
[im 40/79  bone]
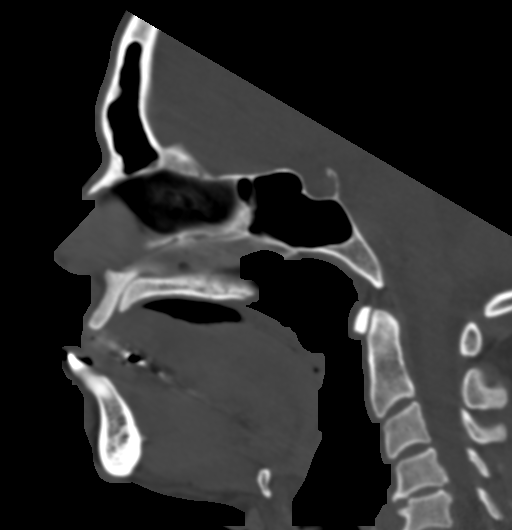
[im 53/79  bone]
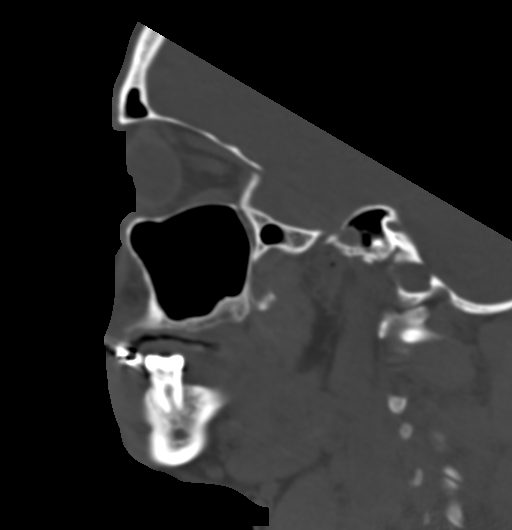

[16 of 47 positions shown; findings below may reference images not displayed]

FINDINGS: Osseous: No fracture or mandibular dislocation. No destructive
process.

Orbits: Negative. No traumatic or inflammatory finding.

Sinuses: Clear.

Soft tissues: Negative.

Limited intracranial: No significant or unexpected finding.
IMPRESSION: 1. No acute facial bone fracture.

## 2021-12-30 IMAGING — CT CT CERVICAL SPINE W/O CM
2 series · 15 of 27 positions shown, 19 images · non-contrast
Comparison: 11/23/2018

CLINICAL DATA: Fell, seizures

EXAM:
CT CERVICAL SPINE WITHOUT CONTRAST
TECHNIQUE: Multidetector CT imaging of the cervical spine was performed without
intravenous contrast. Multiplanar CT image reconstructions were also
generated.

[Series 3: c spine soft · axial · 0.33mm/px · z∈[-266,-142]mm · 10 of 74 slices shown, 13 images]
[im 6/74  soft-tissue]
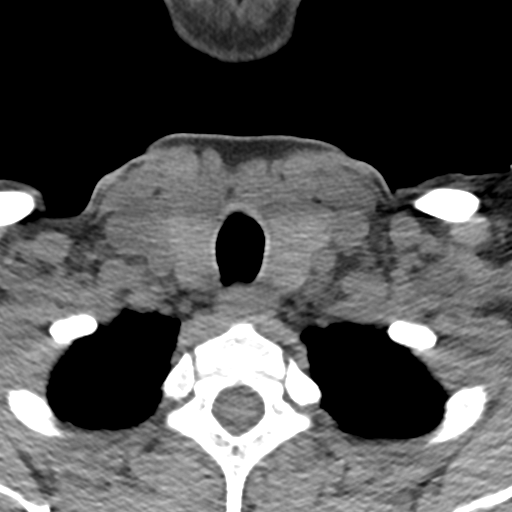
[im 6/74  bone]
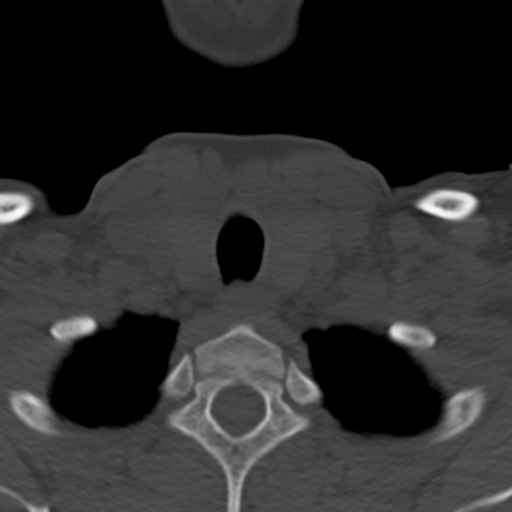
[im 12/74  bone]
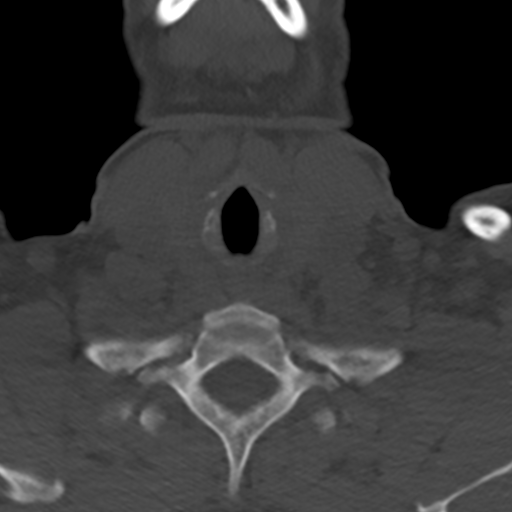
[im 23/74  bone]
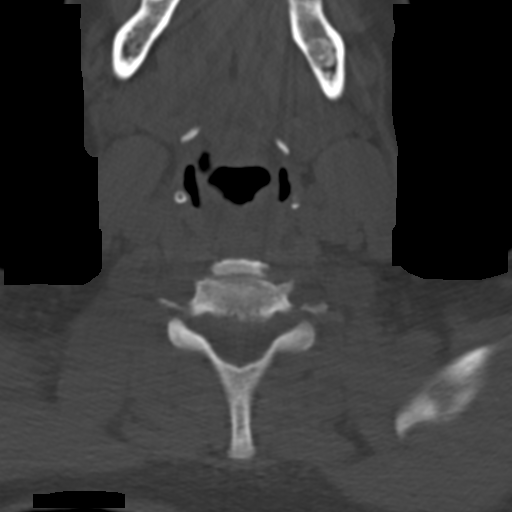
[im 29/74  bone]
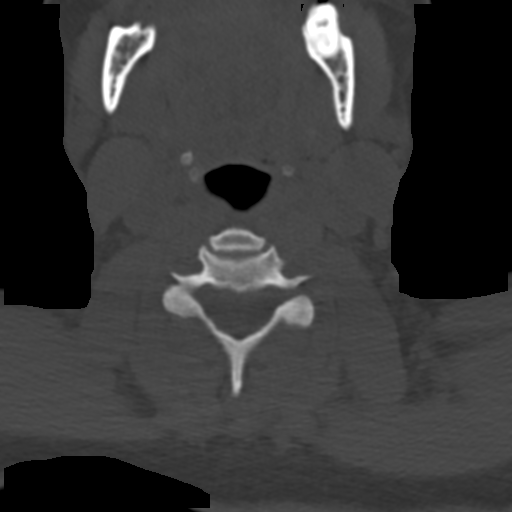
[im 34/74  soft-tissue]
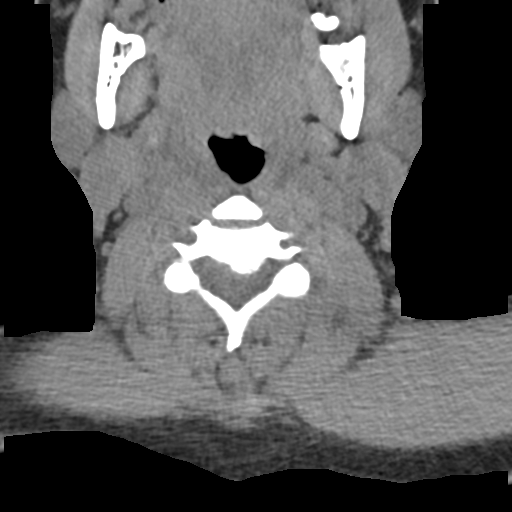
[im 34/74  bone]
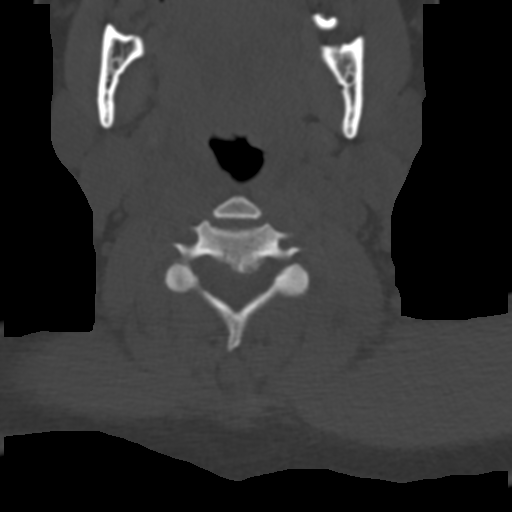
[im 40/74  bone]
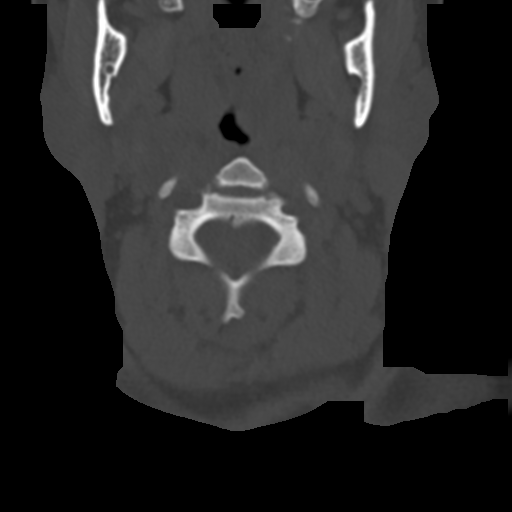
[im 45/74  bone]
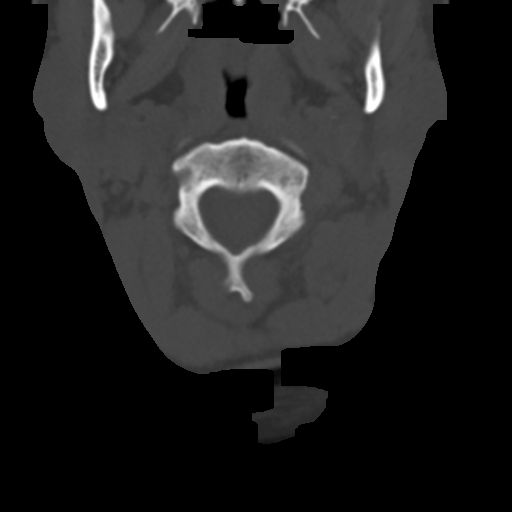
[im 57/74  bone]
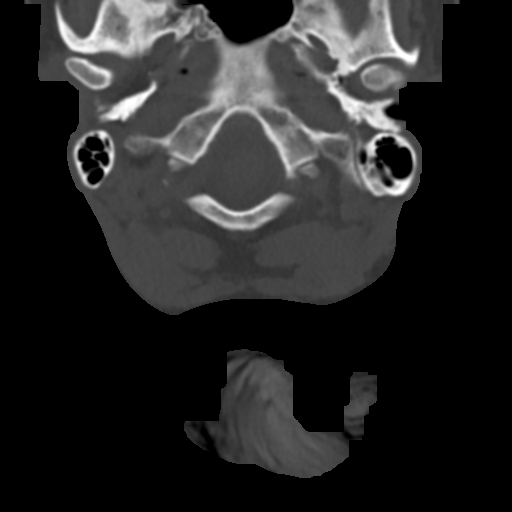
[im 62/74  soft-tissue]
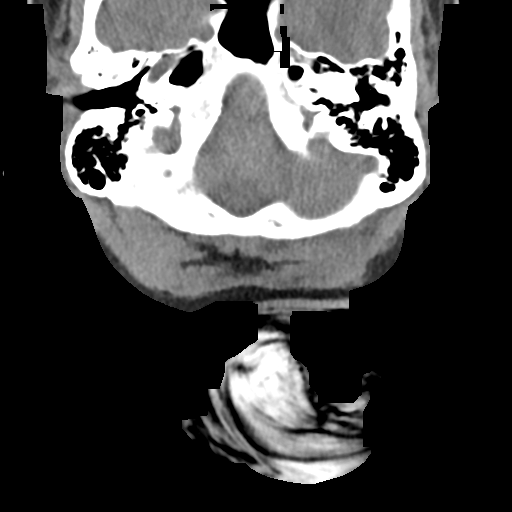
[im 62/74  bone]
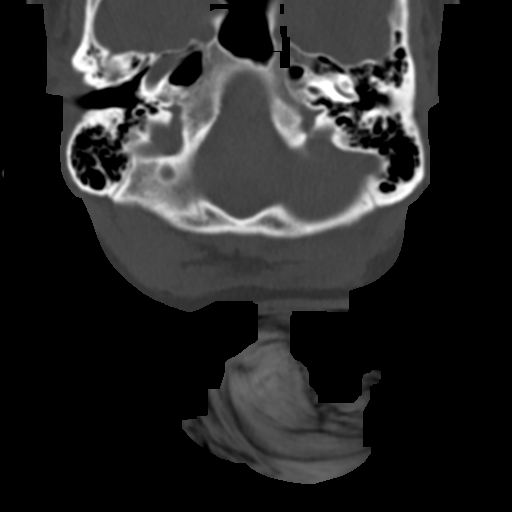
[im 68/74  bone]
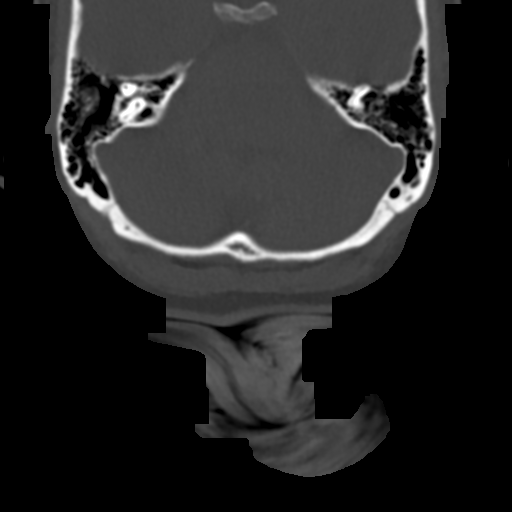

[Series 4: sagittal bone · sagittal · 0.29mm/px · 5 of 55 slices shown, 6 images]
[im 19/55  bone]
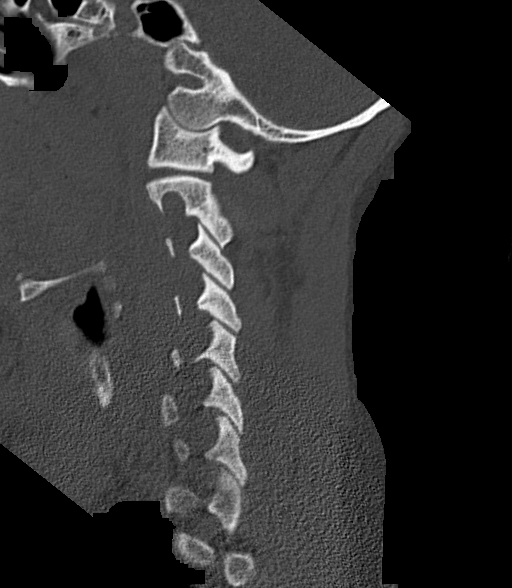
[im 23/55  bone]
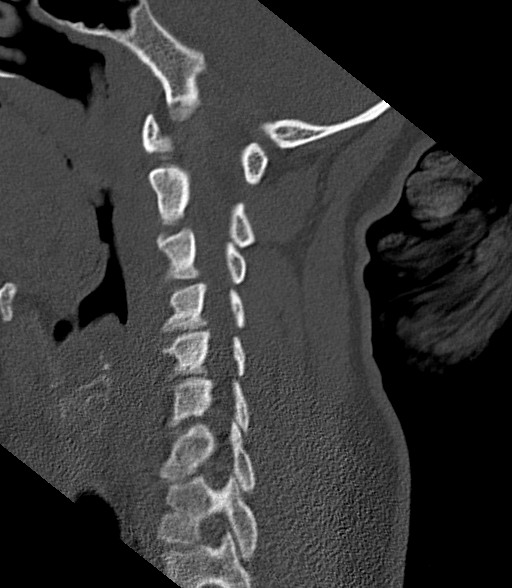
[im 28/55  soft-tissue]
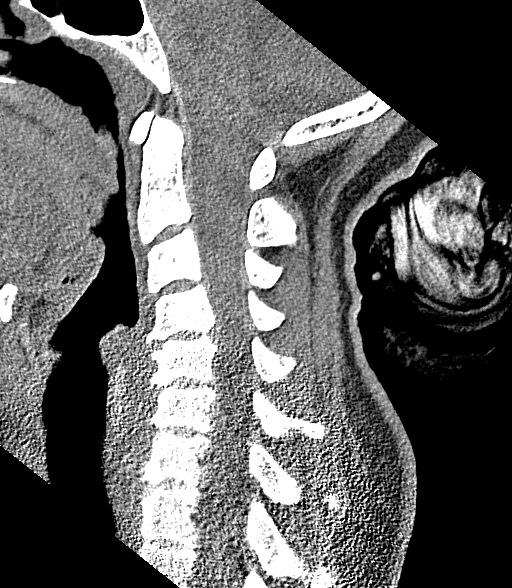
[im 28/55  bone]
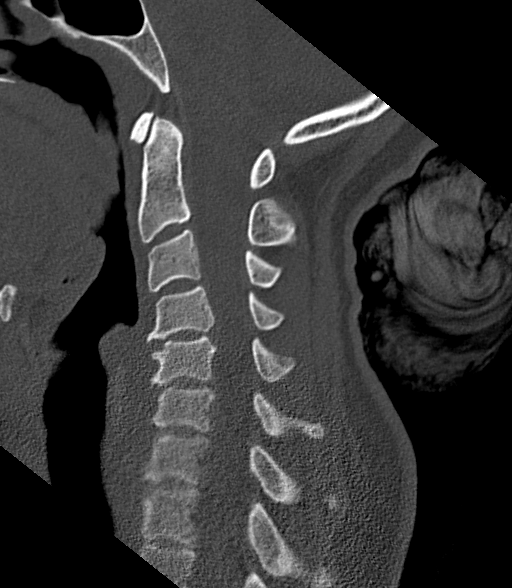
[im 32/55  bone]
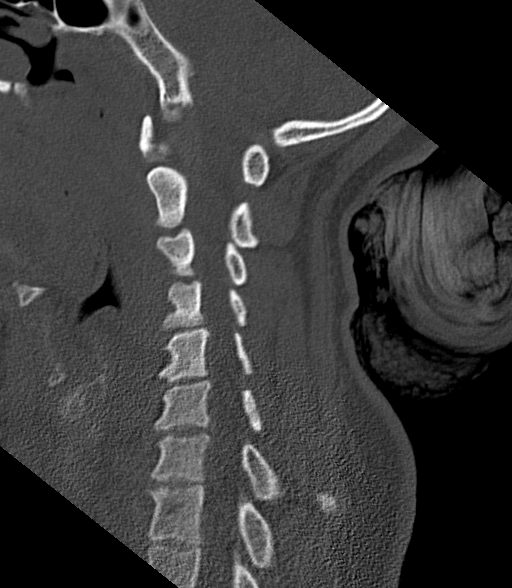
[im 37/55  bone]
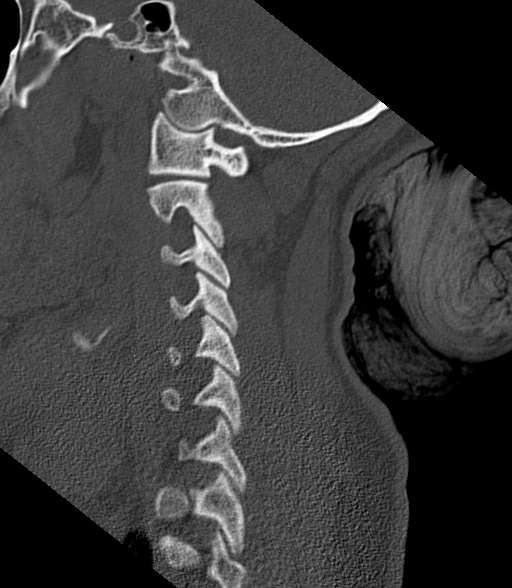

[15 of 27 positions shown; findings below may reference images not displayed]

FINDINGS: Alignment: There is stable loss of cervical lordosis with mild
kyphosis likely due to multilevel spondylosis.

Skull base and vertebrae: No acute displaced fractures.

Soft tissues and spinal canal: No prevertebral fluid or swelling. No
visible canal hematoma.

Disc levels: There is stable multilevel spondylosis most pronounced
at C3-4 and C4-5. No significant bony encroachment upon the central
canal or neural foramina.

Upper chest: Airway is patent.  Lung apices are clear.

Other: Reconstructed images demonstrate no additional findings.
IMPRESSION: 1. Stable mild cervical spondylosis.  No acute displaced fractures.

## 2023-07-01 ENCOUNTER — Other Ambulatory Visit: Payer: Self-pay | Admitting: Family Medicine

## 2023-07-01 DIAGNOSIS — Z1231 Encounter for screening mammogram for malignant neoplasm of breast: Secondary | ICD-10-CM

## 2023-07-14 ENCOUNTER — Ambulatory Visit
Admission: RE | Admit: 2023-07-14 | Discharge: 2023-07-14 | Disposition: A | Discharge status: Home / Self Care | Source: Ambulatory Visit | Attending: Family Medicine | Admitting: Family Medicine

## 2023-07-14 DIAGNOSIS — Z1231 Encounter for screening mammogram for malignant neoplasm of breast: Secondary | ICD-10-CM

## 2023-07-18 ENCOUNTER — Other Ambulatory Visit: Payer: Self-pay | Admitting: Family Medicine

## 2023-07-18 DIAGNOSIS — R928 Other abnormal and inconclusive findings on diagnostic imaging of breast: Secondary | ICD-10-CM

## 2023-08-02 LAB — COLOGUARD

## 2023-08-20 ENCOUNTER — Ambulatory Visit
Admission: RE | Admit: 2023-08-20 | Discharge: 2023-08-20 | Disposition: A | Discharge status: Home / Self Care | Source: Ambulatory Visit | Attending: Family Medicine | Admitting: Family Medicine

## 2023-08-20 ENCOUNTER — Ambulatory Visit: Admitting: Obstetrics and Gynecology

## 2023-08-20 DIAGNOSIS — R928 Other abnormal and inconclusive findings on diagnostic imaging of breast: Secondary | ICD-10-CM

## 2023-09-24 LAB — COLOGUARD

## 2023-10-11 LAB — COLOGUARD: COLOGUARD: POSITIVE — AB

## 2023-12-24 ENCOUNTER — Encounter

## 2023-12-24 ENCOUNTER — Ambulatory Visit
Admission: RE | Admit: 2023-12-24 | Discharge: 2023-12-24 | Disposition: A | Attending: Internal Medicine | Admitting: Internal Medicine

## 2023-12-24 ENCOUNTER — Encounter: Payer: Self-pay | Admitting: Internal Medicine

## 2023-12-24 ENCOUNTER — Encounter: Admission: RE | Disposition: A | Payer: Self-pay | Source: Home / Self Care | Attending: Internal Medicine

## 2023-12-24 ENCOUNTER — Encounter: Payer: Self-pay | Admitting: Anesthesiology

## 2023-12-24 DIAGNOSIS — R1013 Epigastric pain: Secondary | ICD-10-CM | POA: Insufficient documentation

## 2023-12-24 DIAGNOSIS — Z538 Procedure and treatment not carried out for other reasons: Secondary | ICD-10-CM | POA: Diagnosis not present

## 2023-12-24 DIAGNOSIS — Z1211 Encounter for screening for malignant neoplasm of colon: Secondary | ICD-10-CM | POA: Diagnosis not present

## 2023-12-24 DIAGNOSIS — R195 Other fecal abnormalities: Secondary | ICD-10-CM | POA: Insufficient documentation

## 2023-12-24 HISTORY — PX: COLONOSCOPY: SHX5424

## 2023-12-24 HISTORY — PX: ESOPHAGOGASTRODUODENOSCOPY: SHX5428

## 2023-12-24 SURGERY — COLONOSCOPY
Anesthesia: General

## 2023-12-24 MED ORDER — LIDOCAINE HCL (PF) 2 % IJ SOLN
INTRAMUSCULAR | Status: AC
  Filled 2023-12-24: qty 5

## 2023-12-24 MED ORDER — GLYCOPYRROLATE 0.2 MG/ML IJ SOLN
INTRAMUSCULAR | Status: AC
Start: 2023-12-24 — End: 2023-12-24
  Filled 2023-12-24: qty 1

## 2023-12-24 MED ORDER — SODIUM CHLORIDE 0.9 % IV SOLN: INTRAVENOUS | Status: DC

## 2023-12-24 NOTE — OR Nursing (Signed)
 Pt came in to ENDO, proceeded to tell the RN that she ate breakfast and lunch the prior day, started prep at 3pm, ate dinner that evening. We cancelled her in pre-op, sent her home, Dr. Aundria made aware.

## 2023-12-24 NOTE — Interval H&P Note (Signed)
 History and Physical Interval Note:  12/24/2023 2:23 PM  Alexcis Bicking  has presented today for surgery, with the diagnosis of Positive colorectal cancer screening using Cologuard test (R19.5) Abdominal pain, epigastric (R10.13) Gastroesophageal reflux disease, unspecified whether esophagitis present (K21.9) Atypical chest pain (R07.89).  The various methods of treatment have been discussed with the patient and family. After consideration of risks, benefits and other options for treatment, the patient has consented to  Procedure(s): COLONOSCOPY (N/A) EGD (ESOPHAGOGASTRODUODENOSCOPY) (N/A) as a surgical intervention.  The patient's history has been reviewed, patient examined, no change in status, stable for surgery.  I have reviewed the patient's chart and labs.  Questions were answered to the patient's satisfaction.     Horace, Teffany Blaszczyk

## 2023-12-24 NOTE — Interval H&P Note (Signed)
 History and Physical Interval Note:  12/24/2023 2:25 PM  Jordan Bauer  has presented today for surgery, with the diagnosis of Positive colorectal cancer screening using Cologuard test (R19.5) Abdominal pain, epigastric (R10.13) Gastroesophageal reflux disease, unspecified whether esophagitis present (K21.9) Atypical chest pain (R07.89).  The various methods of treatment have been discussed with the patient and family. After consideration of risks, benefits and other options for treatment, the patient has consented to  Procedure(s): COLONOSCOPY (N/A) EGD (ESOPHAGOGASTRODUODENOSCOPY) (N/A) as a surgical intervention.  The patient's history has been reviewed, patient examined,  PATIENT FAILED TO FOLLOW PREP INSTRUCTIONS AND ATE TWO FULL MEALS YESTERDAY RATHER THAN A CLEAR LIQUID DIET..  I have reviewed the patient's chart and labs.  Questions were answered to the patient's satisfaction.  COLONOSCOPY CANCELLED.   [place], Jordan Bauer

## 2023-12-24 NOTE — H&P (Signed)
 Outpatient short stay form Pre-procedure 12/24/2023 2:21 PM Lavita Pontius K. Aundria, M.D.  Primary Physician: Damien Rana, FNP  Reason for visit:  Positive Cologuard dna test, epigastric pain, GERD<, atypical chest pain.  History of present illness:  Patient is a pleasant 48 y/o female/female presenting on referral from primary care provider for a POSITIVE Cologuard result.  Ms. Culhane presents to the [facility] at the request of Dr. Rayna Hemming for chief complaint of positive colorectal cancer screening test using Cologuard and intermittent epigastric abdominal pain. She presents to the clinic by herself this afternoon. She reports she received positive Cologuard result back in August this year. She is colonoscopy naive. She denies any known family history of colorectal cancer, advanced adenomas, or IBD. She denies any major changes in her bowel habits. She typically has 1-3 formed bowel movements daily throughout the day. No issues with hematochezia, melena, fecal urgency, or fecal incontinence. Appetite and diet are stable without any unintentional weight loss. She does have intermittent epigastric abdominal pain over the past 52-months. She reports the epigastric abdominal pain can radiate to her sternum. She was told by her PCP that she had GERD. She has been placed on Nexium 40 mg twice daily and has been taking this for the past few months. Nexium seemingly works better than Prilosec. She is still having 3-4 days out of the week with symptoms of pyrosis, belching, acid regurgitation, and atypical chest pain. No complaints of esophageal dysphagia, odynophagia, early satiety, hoarseness, or vomiting. She does get some mild nausea with the pain. She uses marijuana daily. She smokes ~5 cigarettes daily. Marijuana does help the abdominal pain. She will take ibuprofen  occasionally for the pain and headaches.    Current Facility-Administered Medications:    0.9 %  sodium chloride  infusion, ,  Intravenous, Continuous, Jameeka Marcy K, MD  Medications Prior to Admission  Medication Sig Dispense Refill Last Dose/Taking   albuterol  (PROVENTIL  HFA;VENTOLIN  HFA) 108 (90 BASE) MCG/ACT inhaler Inhale 2 puffs into the lungs every 6 (six) hours as needed for wheezing or shortness of breath.      metroNIDAZOLE  (FLAGYL ) 500 MG tablet Take 1 tablet (500 mg total) by mouth 2 (two) times daily. 14 tablet 0    risperiDONE  (RISPERDAL ) 0.5 MG tablet Take 0.5 mg by mouth at bedtime.       sertraline  (ZOLOFT ) 100 MG tablet Take 100 mg by mouth daily.      topiramate  (TOPAMAX ) 100 MG tablet Take 100 mg by mouth 2 (two) times daily.        Allergies  Allergen Reactions   Amoxicillin Anaphylaxis, Hives and Other (See Comments)    Has patient had a PCN reaction causing immediate rash, facial/tongue/throat swelling, SOB or lightheadedness with hypotension: Yes Has patient had a PCN reaction causing severe rash involving mucus membranes or skin necrosis: No Has patient had a PCN reaction that required hospitalization No Has patient had a PCN reaction occurring within the last 10 years: No If all of the above answers are NO, then may proceed with Cephalosporin use.   Aspirin Anaphylaxis   Keflex [Cephalexin] Other (See Comments)    Reaction:  Unknown    Latex Rash     Past Medical History:  Diagnosis Date   Asthma    Diabetes mellitus without complication ([facility])    Seizures ([facility])    Sickle cell trait    Thyroid disease     Review of systems:  Otherwise negative.    Physical Exam  Gen: Alert, oriented. Appears stated age.  HEENT: [place]/AT. PERRLA. Lungs: CTA, no wheezes. CV: RR nl S1, S2. Abd: soft, benign, no masses. BS+ Ext: No edema. Pulses 2+    Planned procedures: Proceed with EGD and colonoscopy. The patient understands the nature of the planned procedure, indications, risks, alternatives and potential complications including but not limited to bleeding, infection,  perforation, damage to internal organs and possible oversedation/side effects from anesthesia. The patient agrees and gives consent to proceed.  Please refer to procedure notes for findings, recommendations and patient disposition/instructions.     Zolton Dowson K. Aundria, M.D. Gastroenterology 12/24/2023  2:21 PM

## 2024-01-14 ENCOUNTER — Ambulatory Visit: Admitting: Registered Nurse

## 2024-01-14 ENCOUNTER — Ambulatory Visit
Admission: RE | Admit: 2024-01-14 | Discharge: 2024-01-14 | Disposition: A | Discharge status: Home / Self Care | Attending: Internal Medicine | Admitting: Internal Medicine

## 2024-01-14 ENCOUNTER — Encounter: Payer: Self-pay | Admitting: Internal Medicine

## 2024-01-14 ENCOUNTER — Encounter: Admission: RE | Disposition: A | Payer: Self-pay | Source: Home / Self Care | Attending: Internal Medicine

## 2024-01-14 DIAGNOSIS — K2970 Gastritis, unspecified, without bleeding: Secondary | ICD-10-CM | POA: Diagnosis not present

## 2024-01-14 DIAGNOSIS — K635 Polyp of colon: Secondary | ICD-10-CM | POA: Insufficient documentation

## 2024-01-14 DIAGNOSIS — F319 Bipolar disorder, unspecified: Secondary | ICD-10-CM | POA: Insufficient documentation

## 2024-01-14 DIAGNOSIS — K219 Gastro-esophageal reflux disease without esophagitis: Secondary | ICD-10-CM | POA: Insufficient documentation

## 2024-01-14 DIAGNOSIS — D573 Sickle-cell trait: Secondary | ICD-10-CM | POA: Diagnosis not present

## 2024-01-14 DIAGNOSIS — G40909 Epilepsy, unspecified, not intractable, without status epilepticus: Secondary | ICD-10-CM | POA: Insufficient documentation

## 2024-01-14 DIAGNOSIS — F17200 Nicotine dependence, unspecified, uncomplicated: Secondary | ICD-10-CM | POA: Insufficient documentation

## 2024-01-14 DIAGNOSIS — R1013 Epigastric pain: Secondary | ICD-10-CM | POA: Diagnosis present

## 2024-01-14 DIAGNOSIS — K621 Rectal polyp: Secondary | ICD-10-CM | POA: Insufficient documentation

## 2024-01-14 DIAGNOSIS — Z1211 Encounter for screening for malignant neoplasm of colon: Secondary | ICD-10-CM | POA: Diagnosis not present

## 2024-01-14 DIAGNOSIS — E119 Type 2 diabetes mellitus without complications: Secondary | ICD-10-CM | POA: Insufficient documentation

## 2024-01-14 DIAGNOSIS — G90A Postural orthostatic tachycardia syndrome (POTS): Secondary | ICD-10-CM | POA: Diagnosis not present

## 2024-01-14 DIAGNOSIS — J4520 Mild intermittent asthma, uncomplicated: Secondary | ICD-10-CM | POA: Diagnosis not present

## 2024-01-14 DIAGNOSIS — R195 Other fecal abnormalities: Secondary | ICD-10-CM | POA: Insufficient documentation

## 2024-01-14 DIAGNOSIS — K640 First degree hemorrhoids: Secondary | ICD-10-CM | POA: Insufficient documentation

## 2024-01-14 HISTORY — PX: COLONOSCOPY: SHX5424

## 2024-01-14 HISTORY — PX: ESOPHAGOGASTRODUODENOSCOPY: SHX5428

## 2024-01-14 HISTORY — PX: POLYPECTOMY: SHX149

## 2024-01-14 HISTORY — PX: HEMOSTASIS CLIP PLACEMENT: SHX6857

## 2024-01-14 LAB — GLUCOSE, CAPILLARY: Glucose-Capillary: 98 mg/dL (ref 70–99)

## 2024-01-14 SURGERY — COLONOSCOPY
Anesthesia: General

## 2024-01-14 MED ORDER — IPRATROPIUM-ALBUTEROL 0.5-2.5 (3) MG/3ML IN SOLN
3.0000 mL | Freq: Once | RESPIRATORY_TRACT | Status: AC
  Administered 2024-01-14: 3 mL via RESPIRATORY_TRACT

## 2024-01-14 MED ORDER — PHENYLEPHRINE 80 MCG/ML (10ML) SYRINGE FOR IV PUSH (FOR BLOOD PRESSURE SUPPORT)
PREFILLED_SYRINGE | INTRAVENOUS | Status: DC | PRN
  Administered 2024-01-14: 160 ug via INTRAVENOUS

## 2024-01-14 MED ORDER — PROPOFOL 1000 MG/100ML IV EMUL
INTRAVENOUS | Status: AC
  Filled 2024-01-14: qty 100

## 2024-01-14 MED ORDER — LIDOCAINE HCL (CARDIAC) PF 100 MG/5ML IV SOSY
PREFILLED_SYRINGE | INTRAVENOUS | Status: DC | PRN
  Administered 2024-01-14: 100 mg via INTRAVENOUS

## 2024-01-14 MED ORDER — SODIUM CHLORIDE 0.9 % IV SOLN
INTRAVENOUS | Status: DC
  Administered 2024-01-14: 20 mL/h via INTRAVENOUS

## 2024-01-14 MED ORDER — PROPOFOL 500 MG/50ML IV EMUL
INTRAVENOUS | Status: DC | PRN
  Administered 2024-01-14: 100 mg via INTRAVENOUS
  Administered 2024-01-14 (×2): 40 mg via INTRAVENOUS
  Administered 2024-01-14: 100 ug/kg/min via INTRAVENOUS
  Administered 2024-01-14: 20 mg via INTRAVENOUS

## 2024-01-14 MED ORDER — LIDOCAINE HCL (PF) 2 % IJ SOLN
INTRAMUSCULAR | Status: AC
  Filled 2024-01-14: qty 5

## 2024-01-14 MED ORDER — IPRATROPIUM-ALBUTEROL 0.5-2.5 (3) MG/3ML IN SOLN
RESPIRATORY_TRACT | Status: AC
  Filled 2024-01-14: qty 3

## 2024-01-14 MED ORDER — DEXMEDETOMIDINE HCL IN NACL 80 MCG/20ML IV SOLN
INTRAVENOUS | Status: AC
  Filled 2024-01-14: qty 20

## 2024-01-14 MED ORDER — PHENYLEPHRINE 80 MCG/ML (10ML) SYRINGE FOR IV PUSH (FOR BLOOD PRESSURE SUPPORT)
PREFILLED_SYRINGE | INTRAVENOUS | Status: AC
  Filled 2024-01-14: qty 10

## 2024-01-14 MED ORDER — DEXMEDETOMIDINE HCL IN NACL 80 MCG/20ML IV SOLN
INTRAVENOUS | Status: DC | PRN
  Administered 2024-01-14: 4 ug via INTRAVENOUS
  Administered 2024-01-14 (×2): 8 ug via INTRAVENOUS

## 2024-01-14 NOTE — Op Note (Signed)
 [facility] Patient Name: Jordan Bauer Procedure Date: 01/14/2024 9:30 AM MRN: [id number] Account #: [id number] Date of Birth: Jan 26, 1976 Admit Type: Outpatient Age: 48 Room: [address] [facility] ENDO ROOM 2 Gender: Female Note Status: Finalized Instrument Name: [facility] GI Scope [id number] Procedure:             Upper GI endoscopy Indications:           Epigastric abdominal pain, Gastro-esophageal reflux                         disease Providers:             Retta Pitcher K. Aundria MD, MD Referring MD:          Valora Perkins Medicines:             Propofol per Anesthesia Complications:         No immediate complications. Estimated blood loss:                         Minimal. Procedure:             Pre-Anesthesia Assessment:                        - The risks and benefits of the procedure and the                         sedation options and risks were discussed with the                         patient. All questions were answered and informed                         consent was obtained.                        - Patient identification and proposed procedure were                         verified prior to the procedure by the nurse. The                         procedure was verified in the procedure room.                        - ASA Grade Assessment: III - A patient with severe                         systemic disease.                        - After reviewing the risks and benefits, the patient                         was deemed in satisfactory condition to undergo the                         procedure.                        After obtaining informed consent, the endoscope was  passed under direct vision. Throughout the procedure,                         the patient's blood pressure, pulse, and oxygen                         saturations were monitored continuously. The Endoscope                         was introduced through the mouth, and  advanced to the                         third part of duodenum. The upper GI endoscopy was                         accomplished without difficulty. The patient tolerated                         the procedure well. Findings:      The esophagus was normal.      Patchy minimal inflammation characterized by erythema was found in the       gastric body. Biopsies were taken with a cold forceps for Helicobacter       pylori testing. Estimated blood loss was minimal.      The cardia and gastric fundus were normal on retroflexion.      The exam of the stomach was otherwise normal.      The examined duodenum was normal. Impression:            - Normal esophagus.                        - Gastritis. Biopsied.                        - Normal examined duodenum. Recommendation:        - Await pathology results.                        - Proceed with colonoscopy Procedure Code(s):     --- Professional ---                        [phone], Esophagogastroduodenoscopy, flexible,                         transoral; with biopsy, single or multiple Diagnosis Code(s):     --- Professional ---                        K21.9, Gastro-esophageal reflux disease without                         esophagitis                        R10.13, Epigastric pain                        K29.70, Gastritis, unspecified, without bleeding CPT copyright 2022 American Medical Association. All rights reserved. The codes documented in this report are preliminary and upon coder review may  be revised to meet current  compliance requirements. Ladell MARLA Boss MD, MD 01/14/2024 9:46:44 AM This report has been signed electronically. Number of Addenda: 0 Note Initiated On: 01/14/2024 9:30 AM Estimated Blood Loss:  Estimated blood loss was minimal.      [facility]

## 2024-01-14 NOTE — OR Nursing (Signed)
 Patient complained of not being able to breathe upon waking up and also asked for her rescue inhaler. An order for a duoneb was obtained from anesthesiologist. Duoneb was administered without complications. Jordan Bauer breathing is much improved and she is no longer coughing.

## 2024-01-14 NOTE — Anesthesia Procedure Notes (Signed)
 Procedure Name: MAC Date/Time: 01/14/2024 9:36 AM  Performed by: Lorrene Camelia LABOR, CRNAPre-anesthesia Checklist: Patient identified, Emergency Drugs available, Suction available and Patient being monitored Patient Re-evaluated:Patient Re-evaluated prior to induction Oxygen Delivery Method: Simple face mask Preoxygenation: Pre-oxygenation with 100% oxygen Induction Type: IV induction Comments: POM

## 2024-01-14 NOTE — Op Note (Signed)
 [facility] Gastroenterology Patient Name: Jordan Bauer Procedure Date: 01/14/2024 9:30 AM MRN: [id number] Account #: [id number] Date of Birth: [birth year] Admit Type: Outpatient Age: 48 Room: [facility] ENDO ROOM 2 Gender: Female Note Status: Finalized Instrument Name: Colon Scope [id number] Procedure:             Colonoscopy Indications:           Positive Cologuard test Providers:             Llewellyn Schoenberger K. Aundria MD, MD Referring MD:          Valora Perkins Medicines:             Propofol per Anesthesia Complications:         No immediate complications. Estimated blood loss:                         Minimal. Procedure:             Pre-Anesthesia Assessment:                        - The risks and benefits of the procedure and the                         sedation options and risks were discussed with the                         patient. All questions were answered and informed                         consent was obtained.                        - Patient identification and proposed procedure were                         verified prior to the procedure by the nurse. The                         procedure was verified in the procedure room.                        - ASA Grade Assessment: III - A patient with severe                         systemic disease.                        - After reviewing the risks and benefits, the patient                         was deemed in satisfactory condition to undergo the                         procedure.                        After obtaining informed consent, the colonoscope was                         passed under direct vision. Throughout the procedure,  the patient's blood pressure, pulse, and oxygen                         saturations were monitored continuously. The                         Colonoscope was introduced through the anus and                         advanced to the the cecum, identified by  appendiceal                         orifice and ileocecal valve. The colonoscopy was                         performed without difficulty. The patient tolerated                         the procedure well. The quality of the bowel                         preparation was good. The ileocecal valve, appendiceal                         orifice, and rectum were photographed. Findings:      The perianal and digital rectal examinations were normal. Pertinent       negatives include normal sphincter tone and no palpable rectal lesions.      Non-bleeding internal hemorrhoids were found during retroflexion. The       hemorrhoids were Grade I (internal hemorrhoids that do not prolapse).      A 12 mm polyp was found in the splenic flexure. The polyp was sessile.       The polyp was removed with a hot snare. Resection and retrieval were       complete. To prevent bleeding after the polypectomy, one hemostatic clip       was successfully placed (MR conditional). Clip manufacturer: [employer]. There was no bleeding during, or at the end, of the       procedure.      Three sessile polyps were found in the splenic flexure. The polyps were       4 to 6 mm in size. These polyps were removed with a cold snare.       Resection and retrieval were complete. Estimated blood loss was minimal.      A 4 mm polyp was found in the splenic flexure. The polyp was sessile.       The polyp was removed with a cold snare. Resection was complete, but the       polyp tissue was not retrieved. Estimated blood loss was minimal.      Two sessile polyps were found in the sigmoid colon. The polyps were 10       to 12 mm in size. These polyps were removed with a hot snare. Resection       and retrieval were complete. Estimated blood loss: none.      Four sessile polyps were found in the distal sigmoid colon. The polyps       were 9 to 11 mm in size. These polyps were removed with a  hot snare.       Resection and  retrieval were complete. Estimated blood loss: none.      Three sessile polyps were found in the rectum. The polyps were 2 to 3 mm       in size. These polyps were removed with a jumbo cold forceps. Resection       and retrieval were complete. Estimated blood loss was minimal.      The exam was otherwise without abnormality. Impression:            - Non-bleeding internal hemorrhoids.                        - One 12 mm polyp at the splenic flexure, removed with                         a hot snare. Resected and retrieved. Clip (MR                         conditional) was placed. Clip manufacturer: [employer].                        - Three 4 to 6 mm polyps at the splenic flexure,                         removed with a cold snare. Resected and retrieved.                        - One 4 mm polyp at the splenic flexure, removed with                         a cold snare. Complete resection. Polyp tissue not                         retrieved.                        - Two 10 to 12 mm polyps in the sigmoid colon, removed                         with a hot snare. Resected and retrieved.                        - Four 9 to 11 mm polyps in the distal sigmoid colon,                         removed with a hot snare. Resected and retrieved.                        - Three 2 to 3 mm polyps in the rectum, removed with a                         jumbo cold forceps. Resected and retrieved.                        - The  examination was otherwise normal. Recommendation:        - Patient has a contact number available for                         emergencies. The signs and symptoms of potential                         delayed complications were discussed with the patient.                         Return to normal activities tomorrow. Written                         discharge instructions were provided to the patient.                        - Resume previous diet.                         - Continue present medications.                        - Repeat colonoscopy is recommended for surveillance.                         The colonoscopy date will be determined after                         pathology results from today's exam become available                         for review.                        - Await pathology results from EGD, also performed                         today.                        - Consider outpatient biliary workup for abdominal                         pain.                        - Follow up with Jonette Primmer, PA-C in the GI office.                         [phone]                        - Telephone GI office to schedule appointment at the                         next available appointment.                        - The findings and recommendations were discussed with  the patient. Procedure Code(s):     --- Professional ---                        [phone], Colonoscopy, flexible; with removal of                         tumor(s), polyp(s), or other lesion(s) by snare                         technique                        45380, 59, Colonoscopy, flexible; with biopsy, single                         or multiple Diagnosis Code(s):     --- Professional ---                        R19.5, Other fecal abnormalities                        K64.0, First degree hemorrhoids                        D12.8, Benign neoplasm of rectum                        D12.5, Benign neoplasm of sigmoid colon                        D12.3, Benign neoplasm of transverse colon (hepatic                         flexure or splenic flexure) CPT copyright 2022 American Medical Association. All rights reserved. The codes documented in this report are preliminary and upon coder review may  be revised to meet current compliance requirements. Ladell MARLA Boss MD, MD 01/14/2024 10:27:04 AM This report has been signed electronically. Number of Addenda: 0 Note  Initiated On: 01/14/2024 9:30 AM Scope Withdrawal Time: 0 hours 24 minutes 16 seconds  Total Procedure Duration: 0 hours 29 minutes 47 seconds  Estimated Blood Loss:  Estimated blood loss was minimal.      [facility]

## 2024-01-14 NOTE — Anesthesia Postprocedure Evaluation (Signed)
 Anesthesia Post Note  Patient: Jordan Bauer  Procedure(s) Performed: COLONOSCOPY EGD (ESOPHAGOGASTRODUODENOSCOPY) POLYPECTOMY, INTESTINE CONTROL OF HEMORRHAGE, GI TRACT, ENDOSCOPIC, BY CLIPPING OR OVERSEWING  Patient location during evaluation: Endoscopy Anesthesia Type: General Level of consciousness: awake and alert Pain management: pain level controlled Vital Signs Assessment: post-procedure vital signs reviewed and stable Respiratory status: spontaneous breathing, nonlabored ventilation and respiratory function stable Cardiovascular status: blood pressure returned to baseline and stable Postop Assessment: no apparent nausea or vomiting Anesthetic complications: no   No notable events documented.   Last Vitals:  Vitals:   01/14/24 1045 01/14/24 1055  BP: 111/89 109/73  Pulse: 74 75  Resp: 13 12  Temp:    SpO2: 100% 100%    Last Pain:  Vitals:   01/14/24 1025  TempSrc: Temporal  PainSc: 0-No pain                 Fairy POUR Darcell Sabino

## 2024-01-14 NOTE — H&P (Signed)
 Outpatient short stay form Pre-procedure 01/14/2024 9:25 AM Jordan Bauer K. Aundria, M.D.  Primary Physician: Damien Null, FNP  Reason for visit:  Epigastric pain, positive cologuard, GERD  History of present illness:  48 y/o AA female with a PMH of mild intermittent asthma, sickle cell, chronic sinusitis, POTS, Bipolar disorder, tobacco abuse, marijuana use, RLS, and hx of seizure disorder presents to the [facility] for chief complaint of positive colorectal cancer screening test using Cologuard and epigastric abdominal pain     Current Facility-Administered Medications:    0.9 %  sodium chloride  infusion, , Intravenous, Continuous, [place], Makale Pindell K, MD, Last Rate: 20 mL/hr at 01/14/24 0857, 20 mL/hr at 01/14/24 0857  Medications Prior to Admission  Medication Sig Dispense Refill Last Dose/Taking   albuterol  (PROVENTIL  HFA;VENTOLIN  HFA) 108 (90 BASE) MCG/ACT inhaler Inhale 2 puffs into the lungs every 6 (six) hours as needed for wheezing or shortness of breath.   Past Week   metroNIDAZOLE  (FLAGYL ) 500 MG tablet Take 1 tablet (500 mg total) by mouth 2 (two) times daily. 14 tablet 0 Past Week   risperiDONE  (RISPERDAL ) 0.5 MG tablet Take 0.5 mg by mouth at bedtime.    Past Week   sertraline  (ZOLOFT ) 100 MG tablet Take 100 mg by mouth daily.   Past Week   topiramate  (TOPAMAX ) 100 MG tablet Take 100 mg by mouth 2 (two) times daily.   Past Week     Allergies  Allergen Reactions   Amoxicillin Anaphylaxis, Hives and Other (See Comments)    Has patient had a PCN reaction causing immediate rash, facial/tongue/throat swelling, SOB or lightheadedness with hypotension: Yes Has patient had a PCN reaction causing severe rash involving mucus membranes or skin necrosis: No Has patient had a PCN reaction that required hospitalization No Has patient had a PCN reaction occurring within the last 10 years: No If all of the above answers are NO, then may proceed with Cephalosporin use.   Aspirin  Anaphylaxis   Keflex [Cephalexin] Other (See Comments)    Reaction:  Unknown    Latex Rash     Past Medical History:  Diagnosis Date   Asthma    Diabetes mellitus without complication (HCC)    Seizures (HCC)    Sickle cell trait    Thyroid disease     Review of systems:  Otherwise negative.    Physical Exam  Gen: Alert, oriented. Appears stated age.  HEENT: [place]/AT. PERRLA. Lungs: CTA, no wheezes. CV: RR nl S1, S2. Abd: soft, benign, no masses. BS+ Ext: No edema. Pulses 2+    Planned procedures: Proceed with EGD and colonoscopy. The patient understands the nature of the planned procedure, indications, risks, alternatives and potential complications including but not limited to bleeding, infection, perforation, damage to internal organs and possible oversedation/side effects from anesthesia. The patient agrees and gives consent to proceed.  Please refer to procedure notes for findings, recommendations and patient disposition/instructions.     Smera Guyette K. Aundria, M.D. Gastroenterology 01/14/2024  9:25 AM

## 2024-01-14 NOTE — Transfer of Care (Signed)
 Immediate Anesthesia Transfer of Care Note  Patient: Jordan Bauer  Procedure(s) Performed: COLONOSCOPY EGD (ESOPHAGOGASTRODUODENOSCOPY) POLYPECTOMY, INTESTINE  Patient Location: Endoscopy Unit  Anesthesia Type:General  Level of Consciousness: drowsy  Airway & Oxygen Therapy: Patient Spontanous Breathing  Post-op Assessment: Report given to RN and Post -op Vital signs reviewed and stable  Post vital signs: Reviewed and stable  Last Vitals:  Vitals Value Taken Time  BP 98/62 01/14/24 10:25  Temp 36.2 C 01/14/24 10:25  Pulse 66 01/14/24 10:25  Resp 22 01/14/24 10:25  SpO2 100 % 01/14/24 10:25  Vitals shown include unfiled device data.  Last Pain:  Vitals:   01/14/24 1025  TempSrc: Tympanic  PainSc:          Complications: No notable events documented.

## 2024-01-14 NOTE — Anesthesia Preprocedure Evaluation (Signed)
 Anesthesia Evaluation  Patient identified by MRN, date of birth, ID band Patient awake    Reviewed: Allergy & Precautions, NPO status , Patient's Chart, lab work & pertinent test results  History of Anesthesia Complications (+) PROLONGED EMERGENCE and history of anesthetic complications  Airway Mallampati: III  TM Distance: >3 FB Neck ROM: full    Dental  (+) Chipped, Partial Upper, Partial Lower, Poor Dentition, Missing   Pulmonary neg shortness of breath, asthma , Current Smoker and Patient abstained from smoking.   Pulmonary exam normal        Cardiovascular Exercise Tolerance: Good (-) angina negative cardio ROS Normal cardiovascular exam     Neuro/Psych Seizures -, Well Controlled,   negative psych ROS   GI/Hepatic negative GI ROS, Neg liver ROS,neg GERD  ,,  Endo/Other  diabetes, Type 2    Renal/GU negative Renal ROS  negative genitourinary   Musculoskeletal   Abdominal   Peds  Hematology negative hematology ROS (+)   Anesthesia Other Findings Past Medical History: No date: Asthma No date: Diabetes mellitus without complication (HCC) No date: Seizures (HCC) No date: Sickle cell trait No date: Thyroid disease  Past Surgical History: 12/24/2023: COLONOSCOPY; N/A     Comment:  Procedure: COLONOSCOPY;  Surgeon: Toledo, Ladell POUR, MD;              Location: [facility] ENDOSCOPY;  Service: Gastroenterology;                Laterality: N/A; No date: CYSTECTOMY; Right     Comment:  rigfht ovary  12/24/2023: ESOPHAGOGASTRODUODENOSCOPY; N/A     Comment:  Procedure: EGD (ESOPHAGOGASTRODUODENOSCOPY);  Surgeon:               [place], Ladell POUR, MD;  Location: [facility] ENDOSCOPY;                Service: Gastroenterology;  Laterality: N/A; No date: TONSILLECTOMY No date: TUBAL LIGATION  BMI    Body Mass Index: 26.70 kg/m      Reproductive/Obstetrics negative OB ROS                               Anesthesia Physical Anesthesia Plan  ASA: 3  Anesthesia Plan: General   Post-op Pain Management:    Induction: Intravenous  PONV Risk Score and Plan: Propofol infusion and TIVA  Airway Management Planned: Natural Airway and Nasal Cannula  Additional Equipment:   Intra-op Plan:   Post-operative Plan:   Informed Consent: I have reviewed the patients History and Physical, chart, labs and discussed the procedure including the risks, benefits and alternatives for the proposed anesthesia with the patient or authorized representative who has indicated his/her understanding and acceptance.     Dental Advisory Given  Plan Discussed with: Anesthesiologist, CRNA and Surgeon  Anesthesia Plan Comments: (Patient consented for risks of anesthesia including but not limited to:  - adverse reactions to medications - risk of airway placement if required - damage to eyes, teeth, lips or other oral mucosa - nerve damage due to positioning  - sore throat or hoarseness - Damage to heart, brain, nerves, lungs, other parts of body or loss of life  Patient voiced understanding and assent.)        Anesthesia Quick Evaluation

## 2024-01-16 LAB — SURGICAL PATHOLOGY

## 2024-03-03 ENCOUNTER — Other Ambulatory Visit: Payer: Self-pay

## 2024-03-03 DIAGNOSIS — J309 Allergic rhinitis, unspecified: Secondary | ICD-10-CM

## 2024-04-07 ENCOUNTER — Institutional Professional Consult (permissible substitution) (INDEPENDENT_AMBULATORY_CARE_PROVIDER_SITE_OTHER)
